# Patient Record
Sex: Female | Born: 1940
Health system: Southern US, Community
[De-identification: ages and names within clinical notes are randomized; demographics above are authoritative.]

## PROBLEM LIST (undated history)

## (undated) DIAGNOSIS — I10 Essential (primary) hypertension: Secondary | ICD-10-CM

## (undated) DIAGNOSIS — E785 Hyperlipidemia, unspecified: Secondary | ICD-10-CM

## (undated) HISTORY — DX: Hyperlipidemia, unspecified: E78.5

## (undated) HISTORY — DX: Essential (primary) hypertension: I10

## (undated) HISTORY — PX: TUBAL LIGATION: SHX77

## (undated) HISTORY — PX: CATARACT EXTRACTION: SUR2

---

## 1998-09-12 ENCOUNTER — Other Ambulatory Visit: Admission: RE | Admit: 1998-09-12 | Discharge: 1998-09-12 | Payer: Self-pay | Admitting: *Deleted

## 1999-09-04 ENCOUNTER — Other Ambulatory Visit: Admission: RE | Admit: 1999-09-04 | Discharge: 1999-09-04 | Payer: Self-pay | Admitting: Obstetrics and Gynecology

## 1999-12-21 ENCOUNTER — Emergency Department (HOSPITAL_COMMUNITY): Admission: EM | Admit: 1999-12-21 | Discharge: 1999-12-21 | Payer: Self-pay | Admitting: Emergency Medicine

## 2001-01-27 ENCOUNTER — Other Ambulatory Visit: Admission: RE | Admit: 2001-01-27 | Discharge: 2001-01-27 | Payer: Self-pay | Admitting: Obstetrics and Gynecology

## 2003-08-15 ENCOUNTER — Emergency Department (HOSPITAL_COMMUNITY): Admission: EM | Admit: 2003-08-15 | Discharge: 2003-08-15 | Payer: Self-pay | Admitting: Emergency Medicine

## 2013-06-10 LAB — IFOBT (OCCULT BLOOD): IFOBT: NEGATIVE

## 2013-10-14 ENCOUNTER — Encounter: Payer: Self-pay | Admitting: Gastroenterology

## 2013-10-16 ENCOUNTER — Encounter: Payer: Self-pay | Admitting: Gastroenterology

## 2013-10-16 ENCOUNTER — Ambulatory Visit (INDEPENDENT_AMBULATORY_CARE_PROVIDER_SITE_OTHER): Payer: Commercial Managed Care - HMO | Admitting: Gastroenterology

## 2013-10-16 VITALS — BP 132/76 | HR 88 | Ht 59.0 in | Wt 113.0 lb

## 2013-10-16 DIAGNOSIS — Z1211 Encounter for screening for malignant neoplasm of colon: Secondary | ICD-10-CM

## 2013-10-16 DIAGNOSIS — R197 Diarrhea, unspecified: Secondary | ICD-10-CM

## 2013-10-16 NOTE — Patient Instructions (Signed)
It has been recommended to you by your physician that you have a(n)Colonoscopy  completed. Per your request, we did not schedule the procedure(s) today. Please contact our office at [phone] should you decide to have the procedure completed. 

## 2013-10-16 NOTE — Progress Notes (Signed)
    _                                                                                                                History of Present Illness: Chelsea Walton 73 year old white female referred for evaluation of diarrhea.  She developed dysgeusia and diarrhea after starting a couple of different  antihypertensives.  Since stopping these medicines bowels have returned to normal.  She had she has no current GI complaints including abdominal pain, pyrosis, diarrhea or bleeding.  She has never had a colonoscopy.    Past Medical History  Diagnosis Date  . Hypertension   . Hyperlipemia    Past Surgical History  Procedure Laterality Date  . Tubal ligation     family history includes Diabetes in her maternal grandfather and mother; Heart disease in her brother, father, and paternal aunt. There is no history of Colon cancer. Current Outpatient Prescriptions  Medication Sig Dispense Refill  . pravastatin (PRAVACHOL) 20 MG tablet Uses as directed       No current facility-administered medications for this visit.   Allergies as of 10/16/2013 - Review Complete 10/16/2013  Allergen Reaction Noted  . Metoprolol Nausea And Vomiting 10/16/2013  . Tribenzor [olmesartan-amlodipine-hctz]  10/16/2013    reports that she has never smoked. She has never used smokeless tobacco. She reports that she does not drink alcohol or use illicit drugs.     Review of Systems: Pertinent positive and negative review of systems were noted in the above HPI section. All other review of systems were otherwise negative.  Vital signs were reviewed in today's medical record Physical Exam: General: Well developed , well nourished, no acute distress Skin: anicteric Head: Normocephalic and atraumatic Eyes:  sclerae anicteric, EOMI Ears: Normal auditory acuity Mouth: No deformity or lesions Neck: Supple, no masses or thyromegaly Lungs: Clear throughout to auscultation Heart: Regular rate and rhythm; no  murmurs, rubs or bruits Abdomen: Soft, non tender and non distended. No masses, hepatosplenomegaly or hernias noted. Normal Bowel sounds Rectal:deferred Musculoskeletal: Symmetrical with no gross deformities  Skin: No lesions on visible extremities Pulses:  Normal pulses noted Extremities: No clubbing, cyanosis, edema or deformities noted Neurological: Alert oriented x 4, grossly nonfocal Cervical Nodes:  No significant cervical adenopathy Inguinal Nodes: No significant inguinal adenopathy Psychological:  Alert and cooperative. Normal mood and affect  See Assessment and Plan under Problem List

## 2013-10-16 NOTE — Assessment & Plan Note (Signed)
Plan screening colonoscopy 

## 2013-10-16 NOTE — Assessment & Plan Note (Signed)
Very likely medications-related, now asymptomatic off the medicines.

## 2013-10-28 ENCOUNTER — Encounter: Payer: Self-pay | Admitting: Internal Medicine

## 2013-10-29 ENCOUNTER — Encounter: Payer: Self-pay | Admitting: Internal Medicine

## 2014-09-08 DIAGNOSIS — H2513 Age-related nuclear cataract, bilateral: Secondary | ICD-10-CM | POA: Diagnosis not present

## 2014-09-08 DIAGNOSIS — H25012 Cortical age-related cataract, left eye: Secondary | ICD-10-CM | POA: Diagnosis not present

## 2014-12-22 DIAGNOSIS — Z780 Asymptomatic menopausal state: Secondary | ICD-10-CM | POA: Diagnosis not present

## 2015-03-16 DIAGNOSIS — Z6823 Body mass index (BMI) 23.0-23.9, adult: Secondary | ICD-10-CM | POA: Diagnosis not present

## 2015-03-16 DIAGNOSIS — M810 Age-related osteoporosis without current pathological fracture: Secondary | ICD-10-CM | POA: Diagnosis not present

## 2015-07-13 DIAGNOSIS — M810 Age-related osteoporosis without current pathological fracture: Secondary | ICD-10-CM | POA: Diagnosis not present

## 2015-07-13 DIAGNOSIS — E784 Other hyperlipidemia: Secondary | ICD-10-CM | POA: Diagnosis not present

## 2015-07-13 DIAGNOSIS — I10 Essential (primary) hypertension: Secondary | ICD-10-CM | POA: Diagnosis not present

## 2015-07-27 DIAGNOSIS — E784 Other hyperlipidemia: Secondary | ICD-10-CM | POA: Diagnosis not present

## 2015-07-27 DIAGNOSIS — Z6822 Body mass index (BMI) 22.0-22.9, adult: Secondary | ICD-10-CM | POA: Diagnosis not present

## 2015-07-27 DIAGNOSIS — Z23 Encounter for immunization: Secondary | ICD-10-CM | POA: Diagnosis not present

## 2015-07-27 DIAGNOSIS — M810 Age-related osteoporosis without current pathological fracture: Secondary | ICD-10-CM | POA: Diagnosis not present

## 2015-07-27 DIAGNOSIS — Z1389 Encounter for screening for other disorder: Secondary | ICD-10-CM | POA: Diagnosis not present

## 2015-07-27 DIAGNOSIS — Z0000 Encounter for general adult medical examination without abnormal findings: Secondary | ICD-10-CM | POA: Diagnosis not present

## 2015-07-28 DIAGNOSIS — Z1212 Encounter for screening for malignant neoplasm of rectum: Secondary | ICD-10-CM | POA: Diagnosis not present

## 2016-08-03 DIAGNOSIS — N390 Urinary tract infection, site not specified: Secondary | ICD-10-CM | POA: Diagnosis not present

## 2016-08-03 DIAGNOSIS — M810 Age-related osteoporosis without current pathological fracture: Secondary | ICD-10-CM | POA: Diagnosis not present

## 2016-08-03 DIAGNOSIS — I10 Essential (primary) hypertension: Secondary | ICD-10-CM | POA: Diagnosis not present

## 2016-08-03 DIAGNOSIS — E784 Other hyperlipidemia: Secondary | ICD-10-CM | POA: Diagnosis not present

## 2016-08-03 DIAGNOSIS — R8299 Other abnormal findings in urine: Secondary | ICD-10-CM | POA: Diagnosis not present

## 2016-08-09 DIAGNOSIS — Z6824 Body mass index (BMI) 24.0-24.9, adult: Secondary | ICD-10-CM | POA: Diagnosis not present

## 2016-08-09 DIAGNOSIS — I10 Essential (primary) hypertension: Secondary | ICD-10-CM | POA: Diagnosis not present

## 2016-08-09 DIAGNOSIS — M810 Age-related osteoporosis without current pathological fracture: Secondary | ICD-10-CM | POA: Diagnosis not present

## 2016-08-09 DIAGNOSIS — Z1389 Encounter for screening for other disorder: Secondary | ICD-10-CM | POA: Diagnosis not present

## 2016-08-09 DIAGNOSIS — E784 Other hyperlipidemia: Secondary | ICD-10-CM | POA: Diagnosis not present

## 2016-08-09 DIAGNOSIS — Z0000 Encounter for general adult medical examination without abnormal findings: Secondary | ICD-10-CM | POA: Diagnosis not present

## 2016-08-09 DIAGNOSIS — Z23 Encounter for immunization: Secondary | ICD-10-CM | POA: Diagnosis not present

## 2016-08-16 DIAGNOSIS — Z1212 Encounter for screening for malignant neoplasm of rectum: Secondary | ICD-10-CM | POA: Diagnosis not present

## 2016-10-18 DIAGNOSIS — H25012 Cortical age-related cataract, left eye: Secondary | ICD-10-CM | POA: Diagnosis not present

## 2016-10-18 DIAGNOSIS — H2513 Age-related nuclear cataract, bilateral: Secondary | ICD-10-CM | POA: Diagnosis not present

## 2016-10-18 DIAGNOSIS — H5203 Hypermetropia, bilateral: Secondary | ICD-10-CM | POA: Diagnosis not present

## 2017-03-19 DIAGNOSIS — H25812 Combined forms of age-related cataract, left eye: Secondary | ICD-10-CM | POA: Diagnosis not present

## 2017-03-19 DIAGNOSIS — H25012 Cortical age-related cataract, left eye: Secondary | ICD-10-CM | POA: Diagnosis not present

## 2017-03-19 DIAGNOSIS — H2512 Age-related nuclear cataract, left eye: Secondary | ICD-10-CM | POA: Diagnosis not present

## 2017-04-24 DIAGNOSIS — Z961 Presence of intraocular lens: Secondary | ICD-10-CM | POA: Diagnosis not present

## 2017-08-05 DIAGNOSIS — R82998 Other abnormal findings in urine: Secondary | ICD-10-CM | POA: Diagnosis not present

## 2017-08-05 DIAGNOSIS — E7849 Other hyperlipidemia: Secondary | ICD-10-CM | POA: Diagnosis not present

## 2017-08-05 DIAGNOSIS — I10 Essential (primary) hypertension: Secondary | ICD-10-CM | POA: Diagnosis not present

## 2017-08-05 DIAGNOSIS — M810 Age-related osteoporosis without current pathological fracture: Secondary | ICD-10-CM | POA: Diagnosis not present

## 2017-08-12 DIAGNOSIS — E7849 Other hyperlipidemia: Secondary | ICD-10-CM | POA: Diagnosis not present

## 2017-08-12 DIAGNOSIS — Z0000 Encounter for general adult medical examination without abnormal findings: Secondary | ICD-10-CM | POA: Diagnosis not present

## 2017-08-12 DIAGNOSIS — I10 Essential (primary) hypertension: Secondary | ICD-10-CM | POA: Diagnosis not present

## 2017-08-12 DIAGNOSIS — Z6823 Body mass index (BMI) 23.0-23.9, adult: Secondary | ICD-10-CM | POA: Diagnosis not present

## 2017-08-12 DIAGNOSIS — M810 Age-related osteoporosis without current pathological fracture: Secondary | ICD-10-CM | POA: Diagnosis not present

## 2017-08-12 DIAGNOSIS — Z1389 Encounter for screening for other disorder: Secondary | ICD-10-CM | POA: Diagnosis not present

## 2017-08-19 DIAGNOSIS — Z1212 Encounter for screening for malignant neoplasm of rectum: Secondary | ICD-10-CM | POA: Diagnosis not present

## 2017-08-22 ENCOUNTER — Encounter (HOSPITAL_COMMUNITY): Payer: Self-pay | Admitting: Emergency Medicine

## 2017-08-22 ENCOUNTER — Emergency Department (HOSPITAL_COMMUNITY)
Admission: EM | Admit: 2017-08-22 | Discharge: 2017-08-22 | Disposition: A | Discharge status: Home / Self Care | Payer: Commercial Managed Care - HMO | Attending: Emergency Medicine | Admitting: Emergency Medicine

## 2017-08-22 ENCOUNTER — Other Ambulatory Visit: Payer: Self-pay

## 2017-08-22 ENCOUNTER — Emergency Department (HOSPITAL_COMMUNITY): Payer: Commercial Managed Care - HMO

## 2017-08-22 DIAGNOSIS — Z79899 Other long term (current) drug therapy: Secondary | ICD-10-CM | POA: Diagnosis not present

## 2017-08-22 DIAGNOSIS — M79602 Pain in left arm: Secondary | ICD-10-CM | POA: Diagnosis not present

## 2017-08-22 DIAGNOSIS — Z7902 Long term (current) use of antithrombotics/antiplatelets: Secondary | ICD-10-CM | POA: Insufficient documentation

## 2017-08-22 DIAGNOSIS — I10 Essential (primary) hypertension: Secondary | ICD-10-CM | POA: Diagnosis not present

## 2017-08-22 DIAGNOSIS — R030 Elevated blood-pressure reading, without diagnosis of hypertension: Secondary | ICD-10-CM

## 2017-08-22 DIAGNOSIS — R079 Chest pain, unspecified: Secondary | ICD-10-CM | POA: Diagnosis not present

## 2017-08-22 LAB — I-STAT TROPONIN, ED
TROPONIN I, POC: 0 ng/mL (ref 0.00–0.08)
Troponin i, poc: 0 ng/mL (ref 0.00–0.08)

## 2017-08-22 LAB — CBC
HCT: 44.6 % (ref 36.0–46.0)
Hemoglobin: 15.9 g/dL — ABNORMAL HIGH (ref 12.0–15.0)
MCH: 31.5 pg (ref 26.0–34.0)
MCHC: 35.7 g/dL (ref 30.0–36.0)
MCV: 88.5 fL (ref 78.0–100.0)
PLATELETS: 216 10*3/uL (ref 150–400)
RBC: 5.04 MIL/uL (ref 3.87–5.11)
RDW: 12.2 % (ref 11.5–15.5)
WBC: 7.5 10*3/uL (ref 4.0–10.5)

## 2017-08-22 LAB — BASIC METABOLIC PANEL
Anion gap: 14 (ref 5–15)
BUN: 18 mg/dL (ref 6–20)
CO2: 24 mmol/L (ref 22–32)
CREATININE: 0.8 mg/dL (ref 0.44–1.00)
Calcium: 9.6 mg/dL (ref 8.9–10.3)
Chloride: 101 mmol/L (ref 101–111)
GFR calc Af Amer: >60 mL/min (ref >60)
Glucose, Bld: 94 mg/dL (ref 65–99)
POTASSIUM: 3.4 mmol/L — AB (ref 3.5–5.1)
SODIUM: 139 mmol/L (ref 135–145)

## 2017-08-22 IMAGING — DX DG CHEST 2V
2 series · 2 of 2 positions shown · non-contrast
Comparison: None.

CLINICAL DATA: 76 y/o  F; left arm pain.

EXAM:
CHEST  2 VIEW

[chest pa]
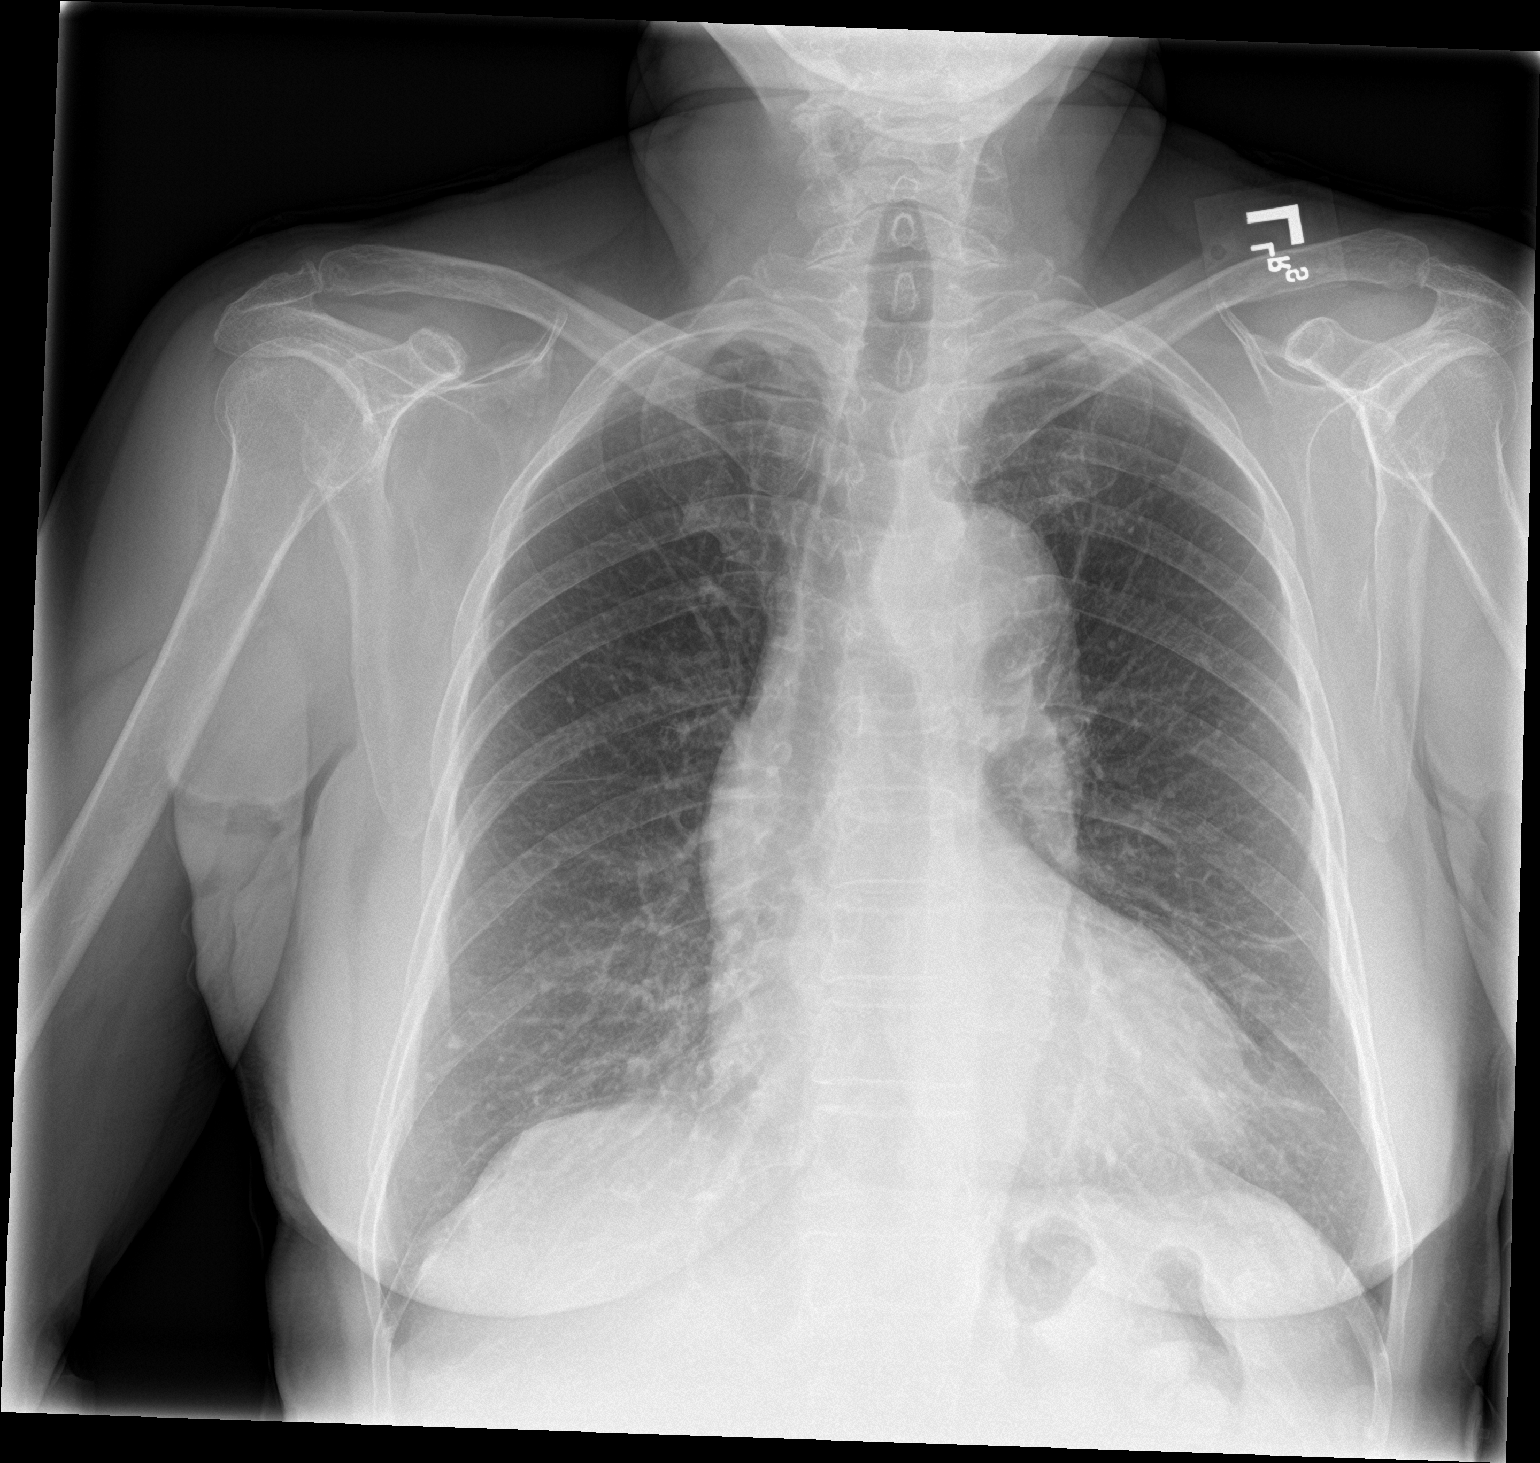

[chest lat]
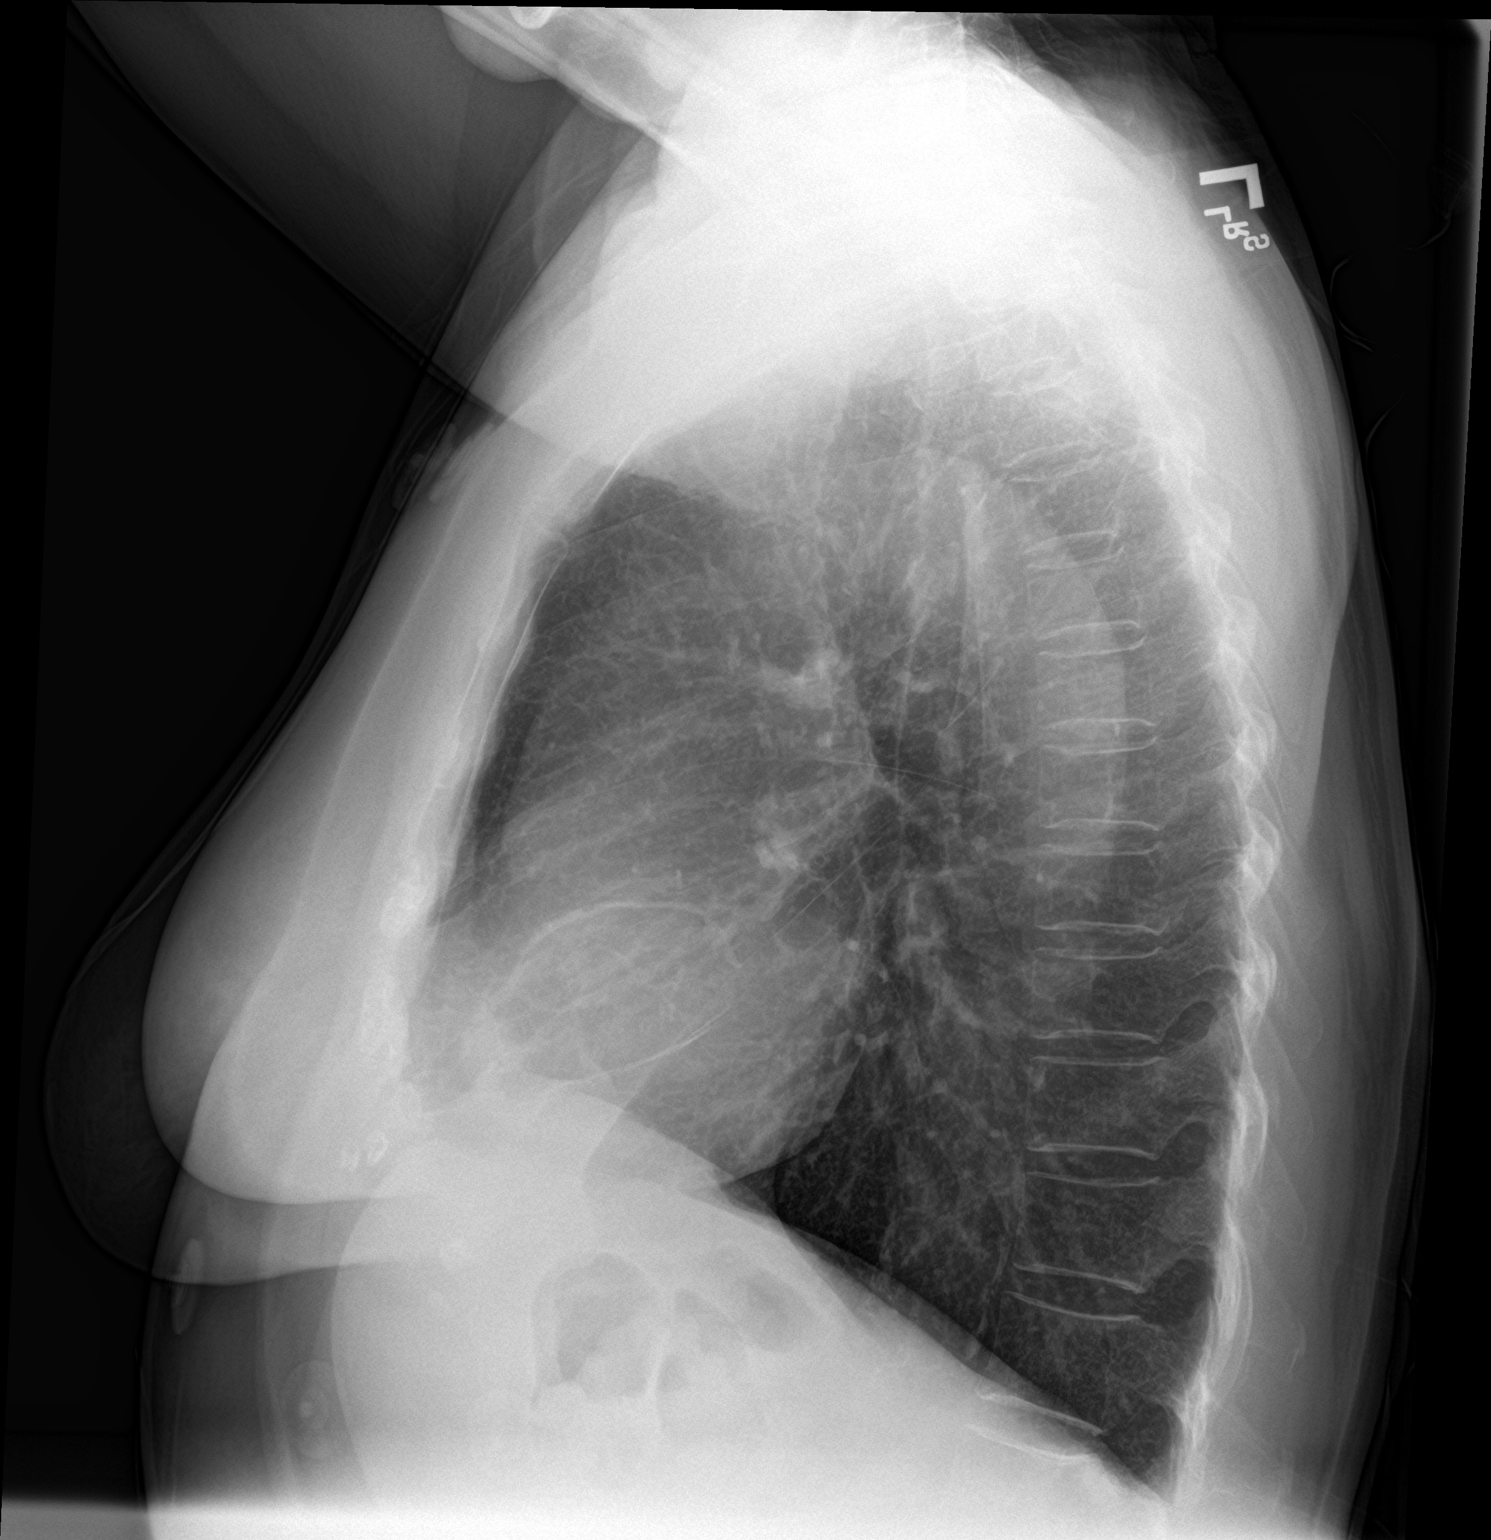

[2 of 2 positions shown; findings below may reference images not displayed]

FINDINGS: Borderline cardiomegaly. Minor atelectasis in left upper lobe
lingula. No consolidation, effusion, or pneumothorax.. The
visualized skeletal structures are unremarkable.
IMPRESSION: No acute pulmonary process identified.

By: MASAAKI M.D.

## 2017-08-22 MED ORDER — CLONIDINE HCL 0.2 MG PO TABS
0.2000 mg | ORAL_TABLET | Freq: Once | ORAL | Status: AC
  Administered 2017-08-22: 0.2 mg via ORAL
  Filled 2017-08-22: qty 1

## 2017-08-22 NOTE — ED Notes (Signed)
Admitting at bedside,  

## 2017-08-22 NOTE — ED Triage Notes (Signed)
Patient with left arm pain for the last 6 hours.  Patient stated that her blood pressure has been high also with the pain.  Patient denies any chest pain at this time.  Patient states that she took her Losartan at midnight and her BP still remains high.  No headache.

## 2017-08-24 NOTE — ED Provider Notes (Signed)
[facility] EMERGENCY DEPARTMENT Provider Note   CSN: [id number] Arrival date & time: 08/22/17  0201     History   Chief Complaint Chief Complaint  Patient presents with  . left arm pain    HPI Chelsea Walton is a 77 y.o. female.  HPI  Patient is a 77 year old female who presents the emergency department with complaints of 6-8 hours of left arm and left upper arm discomfort.  She denies weakness of her arms or legs.  No neck pain.  Denies anterior chest pain.  No associated shortness of breath.  She did check her blood pressure several times at home and reported elevated blood pressures.  She has been compliant with her medications.  She does have a history of hypertension.  No injury or trauma to the left upper extremity.  Denies weakness of the left hand or clumsiness.  Patient is never had symptoms like this before.  No abdominal pain.  Denies nausea vomiting diarrhea.  No fevers or chills or recent upper respiratory symptoms.  Past Medical History:  Diagnosis Date  . Hyperlipemia   . Hypertension     Patient Active Problem List   Diagnosis Date Noted  . Diarrhea 10/16/2013  . Special screening for malignant neoplasms, colon 10/16/2013    Past Surgical History:  Procedure Laterality Date  . TUBAL LIGATION      OB History    No data available       Home Medications    Prior to Admission medications   Medication Sig Start Date End Date Taking? Authorizing Provider  diphenhydramine-acetaminophen (TYLENOL PM) 25-500 MG TABS tablet Take 1 tablet by mouth at bedtime.   Yes [provider]  ibandronate (BONIVA) 150 MG tablet Take 150 mg by mouth every 30 (thirty) days. Take in the morning with a full glass of water, on an empty stomach, and do not take anything else by mouth or lie down for the next 30 min.   Yes [provider]  losartan-hydrochlorothiazide (HYZAAR) 100-12.5 MG tablet Take 1 tablet by mouth daily.   Yes [provider]  pravastatin (PRAVACHOL) 20 MG tablet Take 40 mg by mouth daily. Uses as directed  09/22/13  Yes [provider]    Family History Family History  Problem Relation Age of Onset  . Diabetes Mother   . Diabetes Maternal Grandfather   . Heart disease Father   . Heart disease Brother   . Heart disease Paternal Aunt   . Colon cancer Neg Hx     Social History Social History   Tobacco Use  . Smoking status: Never Smoker  . Smokeless tobacco: Never Used  Substance Use Topics  . Alcohol use: No  . Drug use: No     Allergies   Metoprolol and Tribenzor [olmesartan-amlodipine-hctz]   Review of Systems Review of Systems  All other systems reviewed and are negative.    Physical Exam Updated Vital Signs BP (!) 116/94   Pulse 91   Temp 98.5 F (36.9 C)   Resp 14   SpO2 98%   Physical Exam  Constitutional: She is oriented to person, place, and time. She appears well-developed and well-nourished. No distress.  HENT:  Head: Normocephalic and atraumatic.  Eyes: EOM are normal.  Neck: Normal range of motion.  Cardiovascular: Normal rate, regular rhythm and normal heart sounds.  Pulmonary/Chest: Effort normal and breath sounds normal.  Abdominal: Soft. She exhibits no distension. There is no tenderness.  Musculoskeletal:  Strong radial and PT and DP pulses bilaterally.  No swelling of the left upper extremity as compared to the right.  No significant focal tenderness of the left upper extremity.  Full range of motion of left shoulder, left elbow, left wrist.  Normal grip strength of left hand.  No signs of zoster noted over the distribution of the left arm or left scapular/shoulder region.  Neurological: She is alert and oriented to person, place, and time.  Skin: Skin is warm and dry.  Psychiatric: She has a normal mood and affect. Judgment normal.  Nursing note and vitals reviewed.    ED Treatments / Results  Labs (all labs ordered are listed, but  only abnormal results are displayed) Labs Reviewed  BASIC METABOLIC PANEL - Abnormal; Notable for the following components:      Result Value   Potassium 3.4 (*)    All other components within normal limits  CBC - Abnormal; Notable for the following components:   Hemoglobin 15.9 (*)    All other components within normal limits  I-STAT TROPONIN, ED  I-STAT TROPONIN, ED    EKG  EKG Interpretation  Date/Time:  Thursday August 22 2017 02:14:51 EST Ventricular Rate:  90 PR Interval:  140 QRS Duration: 78 QT Interval:  372 QTC Calculation: 455 R Axis:   -6 Text Interpretation:  Normal sinus rhythm Low voltage QRS Nonspecific ST abnormality Abnormal ECG No old tracing to compare Confirmed by Jola Schmidt [phone]) on 08/22/2017 7:20:01 AM Also confirmed by Jola Schmidt [phone]), editor Hattie Perch ([id number])  on 08/22/2017 8:28:11 AM       Radiology Dg Chest 2 View  Result Date: 08/22/2017 CLINICAL DATA:  77 y/o  F; left arm pain. EXAM: CHEST  2 VIEW COMPARISON:  None. FINDINGS: Borderline cardiomegaly. Minor atelectasis in left upper lobe lingula. No consolidation, effusion, or pneumothorax. The visualized skeletal structures are unremarkable. IMPRESSION: No acute pulmonary process identified. Electronically Signed   By: Kristine Garbe M.D.   On: 08/22/2017 02:32    Procedures Procedures (including critical care time)  Medications Ordered in ED Medications  cloNIDine (CATAPRES) tablet 0.2 mg (0.2 mg Oral Given 08/22/17 0904)     Initial Impression / Assessment and Plan / ED Course  I have reviewed the triage vital signs and the nursing notes.  Pertinent labs & imaging results that were available during my care of the patient were reviewed by me and considered in my medical decision making (see chart for details).     Nonspecific left arm pain in the setting of elevated blood pressure.  Compliant with her medications.  No signs to suggest dissection at this  time.  Normal strong left radial pulse.  Doubt stroke.  Doubt cervical radiculopathy.  Doubt atypical presentation for ACS.  EKG without ischemic changes.  Troponin negative.  Blood pressure improved with oral clonidine here.  I will not add additional medications to her home regimen.  She will check her blood pressure twice a day and begin a log that she can take to her primary care physician for ambulatory blood pressures in the home setting.  Patient and family agreeable to outpatient plan and close primary care follow-up.  I do not think she needs additional testing at this time.  Troponin negative x2.  Final Clinical Impressions(s) / ED Diagnoses   Final diagnoses:  Elevated blood pressure reading  Left arm pain    ED Discharge Orders    None       [place],  Lennette Bihari, MD 08/24/17 [id number]

## 2017-08-27 DIAGNOSIS — I10 Essential (primary) hypertension: Secondary | ICD-10-CM | POA: Diagnosis not present

## 2017-08-27 DIAGNOSIS — Z8249 Family history of ischemic heart disease and other diseases of the circulatory system: Secondary | ICD-10-CM | POA: Diagnosis not present

## 2017-08-27 DIAGNOSIS — Z6824 Body mass index (BMI) 24.0-24.9, adult: Secondary | ICD-10-CM | POA: Diagnosis not present

## 2017-08-28 ENCOUNTER — Telehealth: Payer: Self-pay

## 2017-08-28 DIAGNOSIS — I10 Essential (primary) hypertension: Secondary | ICD-10-CM | POA: Diagnosis not present

## 2017-08-28 NOTE — Telephone Encounter (Signed)
SENT NOTES TO SCHEDULING 

## 2017-09-03 ENCOUNTER — Encounter: Payer: Self-pay | Admitting: Cardiology

## 2017-09-03 ENCOUNTER — Ambulatory Visit: Payer: Medicare HMO | Admitting: Cardiology

## 2017-09-03 VITALS — BP 134/82 | HR 92 | Ht 60.0 in | Wt 123.0 lb

## 2017-09-03 DIAGNOSIS — M79602 Pain in left arm: Secondary | ICD-10-CM | POA: Diagnosis not present

## 2017-09-03 DIAGNOSIS — I10 Essential (primary) hypertension: Secondary | ICD-10-CM

## 2017-09-03 DIAGNOSIS — E7800 Pure hypercholesterolemia, unspecified: Secondary | ICD-10-CM | POA: Diagnosis not present

## 2017-09-03 NOTE — Progress Notes (Signed)
Cardiology Office Note   Date:  09/03/2017   ID:  Chelsea Walton, DOB 14-Sep-1940, MRN [id number]  PCP:  Leanna Battles, MD  Cardiologist:   Destine Zirkle [place], MD   Chief Complaint  Patient presents with  . Arm Pain      History of Present Illness: Chelsea Walton is a 77 y.o. female who is seen at the request of Dr. Philip Aspen for evaluation of left arm pain. She has a history of HTN and HLD. Also has a family history of CAD. She was seen in the ED on 08/22/17 with complaints of left arm pain. Ecg showed nonspecific findings. Troponin level negative x 2. Exam benign except she was hypertensive. On follow up with primary care amlodipine was added for BP control.  She reports that her left arm pain was a persistent dull ache that lasted 4-5 hours. She has not had any recurrent pain. No associated chest pain, dyspnea, palpitations, N/V, or diaphoresis. No history of CAD or prior stress testing. She does walk regularly. She does have a strong family history of CAD/CHF/MI.     Past Medical History:  Diagnosis Date  . Hyperlipemia   . Hypertension     Past Surgical History:  Procedure Laterality Date  . CATARACT EXTRACTION    . TUBAL LIGATION       Current Outpatient Medications  Medication Sig Dispense Refill  . amLODipine (NORVASC) 2.5 MG tablet Take 5 mg by mouth.     . diphenhydramine-acetaminophen (TYLENOL PM) 25-500 MG TABS tablet Take 1 tablet by mouth at bedtime.    . ibandronate (BONIVA) 150 MG tablet Take 150 mg by mouth every 30 (thirty) days. Take in the morning with a full glass of water, on an empty stomach, and do not take anything else by mouth or lie down for the next 30 min.    Marland Kitchen losartan-hydrochlorothiazide (HYZAAR) 100-12.5 MG tablet Take 1 tablet by mouth daily.    . pravastatin (PRAVACHOL) 20 MG tablet Take 40 mg by mouth daily. Uses as directed      No current facility-administered medications for this visit.     Allergies:   Metoprolol and Tribenzor  [olmesartan-amlodipine-hctz]    Social History:  The patient  reports that  has never smoked. she has never used smokeless tobacco. She reports that she does not drink alcohol or use drugs.   Family History:  The patient's family history includes Diabetes in her maternal grandfather and mother; Heart disease in her brother, father, mother, and paternal aunt; Heart failure in her father and mother; Peripheral Artery Disease in her brother.    ROS:  Please see the history of present illness.   Otherwise, review of systems are positive for a 20 year history of intermittent numbness on right side. Prior evaluation with CT and carotid dopplers OK.    All other systems are reviewed and negative.    PHYSICAL EXAM: VS:  BP 134/82 (BP Location: Right Arm)   Pulse 92   Ht 5' (1.524 m)   Wt 123 lb (55.8 kg)   BMI 24.02 kg/m  , BMI Body mass index is 24.02 kg/m. GEN: Well nourished, well developed, in no acute distress  HEENT: normal  Neck: no JVD, carotid bruits, or masses Cardiac: RRR; no murmurs, rubs, or gallops,no edema  Respiratory:  clear to auscultation bilaterally, normal work of breathing GI: soft, nontender, nondistended, + BS MS: no deformity or atrophy  Skin: warm and dry, no rash Neuro:  Strength and sensation are intact Psych: euthymic mood, full affect   EKG:  EKG is not ordered today. The ekg ordered from 08/22/17 demonstrates NSR with mild nonspecific ST abnormality. I have personally reviewed and interpreted this study.    Recent Labs: 08/22/2017: BUN 18; Creatinine, Ser 0.80; Hemoglobin 15.9; Platelets 216; Potassium 3.4; Sodium 139    Lipid Panel No results found for: CHOL, TRIG, HDL, CHOLHDL, VLDL, LDLCALC, LDLDIRECT    Wt Readings from Last 3 Encounters:  09/03/17 123 lb (55.8 kg)  10/16/13 113 lb (51.3 kg)      Other studies Reviewed: Additional studies/ records that were reviewed today include: none   ASSESSMENT AND PLAN:  1.  Acute left arm pain.  Unclear etiology. Concerned this could be an anginal equivalent symptom in patient with risk factors of HTN, HLD, and family history of CAD. Recommend further ischemic work up with a stress Myoview. If normal would focus on risk factor modification.  2. HTN well controlled today. Tolerating medication 3. Hypercholesterolemia. On statin.   Current medicines are reviewed at length with the patient today.  The patient does not have concerns regarding medicines.  The following changes have been made:  no change  Labs/ tests ordered today include:   Orders Placed This Encounter  Procedures  . Myocardial Perfusion Imaging     Signed, Mailey Landstrom [place], MD  09/03/2017 2:06 PM    [facility] [address] [place], [place], [address] Phone [phone], Fax [phone]

## 2017-09-03 NOTE — Patient Instructions (Signed)
Schedule stress myoview

## 2017-09-17 ENCOUNTER — Telehealth (HOSPITAL_COMMUNITY): Payer: Self-pay

## 2017-09-17 NOTE — Telephone Encounter (Signed)
Encounter complete. 

## 2017-09-18 ENCOUNTER — Telehealth (HOSPITAL_COMMUNITY): Payer: Self-pay

## 2017-09-18 NOTE — Telephone Encounter (Signed)
Encounter complete. 

## 2017-09-19 ENCOUNTER — Ambulatory Visit (HOSPITAL_COMMUNITY)
Admission: RE | Admit: 2017-09-19 | Discharge: 2017-09-19 | Disposition: A | Discharge status: Home / Self Care | Payer: Medicare HMO | Source: Ambulatory Visit | Attending: Cardiology | Admitting: Cardiology

## 2017-09-19 DIAGNOSIS — I10 Essential (primary) hypertension: Secondary | ICD-10-CM

## 2017-09-19 DIAGNOSIS — Z8249 Family history of ischemic heart disease and other diseases of the circulatory system: Secondary | ICD-10-CM | POA: Insufficient documentation

## 2017-09-19 DIAGNOSIS — E7800 Pure hypercholesterolemia, unspecified: Secondary | ICD-10-CM | POA: Diagnosis not present

## 2017-09-19 DIAGNOSIS — M79602 Pain in left arm: Secondary | ICD-10-CM

## 2017-09-19 MED ORDER — TECHNETIUM TC 99M TETROFOSMIN IV KIT
31.7000 | PACK | Freq: Once | INTRAVENOUS | Status: AC | PRN
  Administered 2017-09-19: 31.7 via INTRAVENOUS
  Filled 2017-09-19: qty 32

## 2017-09-19 MED ORDER — TECHNETIUM TC 99M TETROFOSMIN IV KIT
10.1000 | PACK | Freq: Once | INTRAVENOUS | Status: AC | PRN
  Administered 2017-09-19: 10.1 via INTRAVENOUS
  Filled 2017-09-19: qty 11

## 2017-09-20 LAB — MYOCARDIAL PERFUSION IMAGING
CHL CUP NUCLEAR SDS: 0
CHL CUP NUCLEAR SSS: 0
CHL RATE OF PERCEIVED EXERTION: 18
CSEPEW: 8.5 METS
Exercise duration (min): 7 min
Exercise duration (sec): 0 s
LV dias vol: 59 mL (ref 46–106)
LVSYSVOL: 24 mL
MPHR: 145 {beats}/min
NUC STRESS TID: 1.03
Peak HR: 142 {beats}/min
Percent HR: 97 %
Rest HR: 78 {beats}/min
SRS: 0

## 2017-09-26 DIAGNOSIS — I10 Essential (primary) hypertension: Secondary | ICD-10-CM | POA: Diagnosis not present

## 2017-09-26 DIAGNOSIS — E7849 Other hyperlipidemia: Secondary | ICD-10-CM | POA: Diagnosis not present

## 2017-09-26 DIAGNOSIS — Z6824 Body mass index (BMI) 24.0-24.9, adult: Secondary | ICD-10-CM | POA: Diagnosis not present

## 2017-09-30 ENCOUNTER — Telehealth: Payer: Self-pay | Admitting: Cardiology

## 2017-09-30 NOTE — Telephone Encounter (Signed)
I don't recommend she take ASA. Her last LDL was 128 on 40 mg pravastatin. Goal would be LDL < 100. I think it is reasonable to increase pravastatin to 80 mg daily  Hayden Kihara Martinique MD, [facility]

## 2017-09-30 NOTE — Telephone Encounter (Signed)
Patient made aware of results and verbalized her understanding.   Notes recorded by Martinique, Peter M, MD on 09/20/2017 at 12:39 PM EST Stress Myoview is normal.  She would like to know: -if she should be taking a 81 mg Aspirin - if her Pravastatin should be increased to 80 mg daily. According to her, her PCP was wondering if it needs to be increased. She did not know why this was mentioned and nothing is in Epic.

## 2017-09-30 NOTE — Telephone Encounter (Signed)
Received call from patient.Dr.Jordan's recommendations given.

## 2017-09-30 NOTE — Telephone Encounter (Signed)
Returned call to patient no answer.LMTC. 

## 2017-09-30 NOTE — Telephone Encounter (Signed)
Patient returning call.

## 2018-04-24 DIAGNOSIS — H2511 Age-related nuclear cataract, right eye: Secondary | ICD-10-CM | POA: Diagnosis not present

## 2018-04-24 DIAGNOSIS — H524 Presbyopia: Secondary | ICD-10-CM | POA: Diagnosis not present

## 2018-04-24 DIAGNOSIS — H5203 Hypermetropia, bilateral: Secondary | ICD-10-CM | POA: Diagnosis not present

## 2018-06-12 DIAGNOSIS — Z23 Encounter for immunization: Secondary | ICD-10-CM | POA: Diagnosis not present

## 2018-08-08 DIAGNOSIS — R82998 Other abnormal findings in urine: Secondary | ICD-10-CM | POA: Diagnosis not present

## 2018-08-08 DIAGNOSIS — I10 Essential (primary) hypertension: Secondary | ICD-10-CM | POA: Diagnosis not present

## 2018-08-08 DIAGNOSIS — M810 Age-related osteoporosis without current pathological fracture: Secondary | ICD-10-CM | POA: Diagnosis not present

## 2018-08-08 DIAGNOSIS — E7849 Other hyperlipidemia: Secondary | ICD-10-CM | POA: Diagnosis not present

## 2018-08-15 DIAGNOSIS — Z6824 Body mass index (BMI) 24.0-24.9, adult: Secondary | ICD-10-CM | POA: Diagnosis not present

## 2018-08-15 DIAGNOSIS — Z1389 Encounter for screening for other disorder: Secondary | ICD-10-CM | POA: Diagnosis not present

## 2018-08-15 DIAGNOSIS — R0789 Other chest pain: Secondary | ICD-10-CM | POA: Diagnosis not present

## 2018-08-15 DIAGNOSIS — Z0000 Encounter for general adult medical examination without abnormal findings: Secondary | ICD-10-CM | POA: Diagnosis not present

## 2018-08-15 DIAGNOSIS — M810 Age-related osteoporosis without current pathological fracture: Secondary | ICD-10-CM | POA: Diagnosis not present

## 2018-08-15 DIAGNOSIS — I10 Essential (primary) hypertension: Secondary | ICD-10-CM | POA: Diagnosis not present

## 2018-08-15 DIAGNOSIS — E7849 Other hyperlipidemia: Secondary | ICD-10-CM | POA: Diagnosis not present

## 2018-08-19 DIAGNOSIS — Z1212 Encounter for screening for malignant neoplasm of rectum: Secondary | ICD-10-CM | POA: Diagnosis not present

## 2019-04-27 DIAGNOSIS — H2511 Age-related nuclear cataract, right eye: Secondary | ICD-10-CM | POA: Diagnosis not present

## 2019-04-27 DIAGNOSIS — Z961 Presence of intraocular lens: Secondary | ICD-10-CM | POA: Diagnosis not present

## 2019-04-27 DIAGNOSIS — H26492 Other secondary cataract, left eye: Secondary | ICD-10-CM | POA: Diagnosis not present

## 2019-04-27 DIAGNOSIS — H5203 Hypermetropia, bilateral: Secondary | ICD-10-CM | POA: Diagnosis not present

## 2019-06-12 DIAGNOSIS — Z23 Encounter for immunization: Secondary | ICD-10-CM | POA: Diagnosis not present

## 2019-08-18 DIAGNOSIS — M810 Age-related osteoporosis without current pathological fracture: Secondary | ICD-10-CM | POA: Diagnosis not present

## 2019-08-18 DIAGNOSIS — E7849 Other hyperlipidemia: Secondary | ICD-10-CM | POA: Diagnosis not present

## 2019-08-21 DIAGNOSIS — Z1339 Encounter for screening examination for other mental health and behavioral disorders: Secondary | ICD-10-CM | POA: Diagnosis not present

## 2019-08-21 DIAGNOSIS — I10 Essential (primary) hypertension: Secondary | ICD-10-CM | POA: Diagnosis not present

## 2019-08-21 DIAGNOSIS — E785 Hyperlipidemia, unspecified: Secondary | ICD-10-CM | POA: Diagnosis not present

## 2019-08-21 DIAGNOSIS — M810 Age-related osteoporosis without current pathological fracture: Secondary | ICD-10-CM | POA: Diagnosis not present

## 2019-08-21 DIAGNOSIS — Z0000 Encounter for general adult medical examination without abnormal findings: Secondary | ICD-10-CM | POA: Diagnosis not present

## 2019-08-21 DIAGNOSIS — Z1331 Encounter for screening for depression: Secondary | ICD-10-CM | POA: Diagnosis not present

## 2019-11-05 DIAGNOSIS — J329 Chronic sinusitis, unspecified: Secondary | ICD-10-CM | POA: Diagnosis not present

## 2019-11-05 DIAGNOSIS — J029 Acute pharyngitis, unspecified: Secondary | ICD-10-CM | POA: Diagnosis not present

## 2020-01-11 DIAGNOSIS — M25551 Pain in right hip: Secondary | ICD-10-CM | POA: Diagnosis not present

## 2020-01-11 DIAGNOSIS — M25559 Pain in unspecified hip: Secondary | ICD-10-CM | POA: Diagnosis not present

## 2020-01-11 DIAGNOSIS — M810 Age-related osteoporosis without current pathological fracture: Secondary | ICD-10-CM | POA: Diagnosis not present

## 2020-01-11 DIAGNOSIS — M1612 Unilateral primary osteoarthritis, left hip: Secondary | ICD-10-CM | POA: Diagnosis not present

## 2020-01-11 DIAGNOSIS — M25552 Pain in left hip: Secondary | ICD-10-CM | POA: Diagnosis not present

## 2020-01-11 DIAGNOSIS — M1611 Unilateral primary osteoarthritis, right hip: Secondary | ICD-10-CM | POA: Diagnosis not present

## 2020-01-30 ENCOUNTER — Inpatient Hospital Stay (HOSPITAL_COMMUNITY)
Admission: EM | Admit: 2020-01-30 | Discharge: 2020-02-02 | DRG: 378 | Disposition: A | Discharge status: Home / Self Care | Payer: Medicare HMO | Attending: Internal Medicine | Admitting: Internal Medicine

## 2020-01-30 ENCOUNTER — Encounter (HOSPITAL_COMMUNITY): Payer: Self-pay

## 2020-01-30 ENCOUNTER — Other Ambulatory Visit: Payer: Self-pay

## 2020-01-30 DIAGNOSIS — D62 Acute posthemorrhagic anemia: Secondary | ICD-10-CM | POA: Diagnosis not present

## 2020-01-30 DIAGNOSIS — D122 Benign neoplasm of ascending colon: Secondary | ICD-10-CM | POA: Diagnosis present

## 2020-01-30 DIAGNOSIS — Z79899 Other long term (current) drug therapy: Secondary | ICD-10-CM | POA: Diagnosis not present

## 2020-01-30 DIAGNOSIS — D649 Anemia, unspecified: Secondary | ICD-10-CM | POA: Diagnosis not present

## 2020-01-30 DIAGNOSIS — R944 Abnormal results of kidney function studies: Secondary | ICD-10-CM | POA: Diagnosis present

## 2020-01-30 DIAGNOSIS — E785 Hyperlipidemia, unspecified: Secondary | ICD-10-CM | POA: Diagnosis present

## 2020-01-30 DIAGNOSIS — D123 Benign neoplasm of transverse colon: Secondary | ICD-10-CM | POA: Diagnosis present

## 2020-01-30 DIAGNOSIS — K921 Melena: Secondary | ICD-10-CM | POA: Diagnosis not present

## 2020-01-30 DIAGNOSIS — K922 Gastrointestinal hemorrhage, unspecified: Secondary | ICD-10-CM | POA: Diagnosis not present

## 2020-01-30 DIAGNOSIS — K6289 Other specified diseases of anus and rectum: Secondary | ICD-10-CM | POA: Diagnosis not present

## 2020-01-30 DIAGNOSIS — Z8249 Family history of ischemic heart disease and other diseases of the circulatory system: Secondary | ICD-10-CM

## 2020-01-30 DIAGNOSIS — R58 Hemorrhage, not elsewhere classified: Secondary | ICD-10-CM | POA: Diagnosis not present

## 2020-01-30 DIAGNOSIS — R55 Syncope and collapse: Secondary | ICD-10-CM | POA: Diagnosis not present

## 2020-01-30 DIAGNOSIS — R52 Pain, unspecified: Secondary | ICD-10-CM | POA: Diagnosis not present

## 2020-01-30 DIAGNOSIS — K3189 Other diseases of stomach and duodenum: Secondary | ICD-10-CM | POA: Diagnosis present

## 2020-01-30 DIAGNOSIS — Z888 Allergy status to other drugs, medicaments and biological substances status: Secondary | ICD-10-CM

## 2020-01-30 DIAGNOSIS — D509 Iron deficiency anemia, unspecified: Secondary | ICD-10-CM | POA: Diagnosis not present

## 2020-01-30 DIAGNOSIS — K222 Esophageal obstruction: Secondary | ICD-10-CM | POA: Diagnosis not present

## 2020-01-30 DIAGNOSIS — K31819 Angiodysplasia of stomach and duodenum without bleeding: Secondary | ICD-10-CM | POA: Diagnosis not present

## 2020-01-30 DIAGNOSIS — K5730 Diverticulosis of large intestine without perforation or abscess without bleeding: Secondary | ICD-10-CM | POA: Diagnosis not present

## 2020-01-30 DIAGNOSIS — R000 Tachycardia, unspecified: Secondary | ICD-10-CM | POA: Diagnosis not present

## 2020-01-30 DIAGNOSIS — K449 Diaphragmatic hernia without obstruction or gangrene: Secondary | ICD-10-CM | POA: Diagnosis not present

## 2020-01-30 DIAGNOSIS — I959 Hypotension, unspecified: Secondary | ICD-10-CM | POA: Diagnosis not present

## 2020-01-30 DIAGNOSIS — K5520 Angiodysplasia of colon without hemorrhage: Secondary | ICD-10-CM | POA: Diagnosis present

## 2020-01-30 DIAGNOSIS — I10 Essential (primary) hypertension: Secondary | ICD-10-CM | POA: Diagnosis present

## 2020-01-30 DIAGNOSIS — Z833 Family history of diabetes mellitus: Secondary | ICD-10-CM

## 2020-01-30 DIAGNOSIS — Z886 Allergy status to analgesic agent status: Secondary | ICD-10-CM | POA: Diagnosis not present

## 2020-01-30 DIAGNOSIS — R0902 Hypoxemia: Secondary | ICD-10-CM | POA: Diagnosis not present

## 2020-01-30 DIAGNOSIS — Z20822 Contact with and (suspected) exposure to covid-19: Secondary | ICD-10-CM | POA: Diagnosis present

## 2020-01-30 LAB — CBC WITH DIFFERENTIAL/PLATELET
Abs Immature Granulocytes: 0.21 10*3/uL — ABNORMAL HIGH (ref 0.00–0.07)
Basophils Absolute: 0 10*3/uL (ref 0.0–0.1)
Basophils Relative: 0 %
Eosinophils Absolute: 0.2 10*3/uL (ref 0.0–0.5)
Eosinophils Relative: 1 %
HCT: 24.6 % — ABNORMAL LOW (ref 36.0–46.0)
Hemoglobin: 7.6 g/dL — ABNORMAL LOW (ref 12.0–15.0)
Immature Granulocytes: 2 %
Lymphocytes Relative: 16 %
Lymphs Abs: 2 10*3/uL (ref 0.7–4.0)
MCH: 28 pg (ref 26.0–34.0)
MCHC: 30.9 g/dL (ref 30.0–36.0)
MCV: 90.8 fL (ref 80.0–100.0)
Monocytes Absolute: 0.8 10*3/uL (ref 0.1–1.0)
Monocytes Relative: 7 %
Neutro Abs: 9.4 10*3/uL — ABNORMAL HIGH (ref 1.7–7.7)
Neutrophils Relative %: 74 %
Platelets: 295 10*3/uL (ref 150–400)
RBC: 2.71 MIL/uL — ABNORMAL LOW (ref 3.87–5.11)
RDW: 13.2 % (ref 11.5–15.5)
WBC: 12.6 10*3/uL — ABNORMAL HIGH (ref 4.0–10.5)
nRBC: 0 % (ref 0.0–0.2)

## 2020-01-30 LAB — POC OCCULT BLOOD, ED: Fecal Occult Bld: POSITIVE — AB

## 2020-01-30 LAB — SARS CORONAVIRUS 2 BY RT PCR (HOSPITAL ORDER, PERFORMED IN ~~LOC~~ HOSPITAL LAB): SARS Coronavirus 2: NEGATIVE

## 2020-01-30 LAB — COMPREHENSIVE METABOLIC PANEL
ALT: 22 U/L (ref 0–44)
AST: 16 U/L (ref 15–41)
Albumin: 3.1 g/dL — ABNORMAL LOW (ref 3.5–5.0)
Alkaline Phosphatase: 92 U/L (ref 38–126)
Anion gap: 11 (ref 5–15)
BUN: 30 mg/dL — ABNORMAL HIGH (ref 8–23)
CO2: 24 mmol/L (ref 22–32)
Calcium: 8.7 mg/dL — ABNORMAL LOW (ref 8.9–10.3)
Chloride: 100 mmol/L (ref 98–111)
Creatinine, Ser: 1.03 mg/dL — ABNORMAL HIGH (ref 0.44–1.00)
GFR calc Af Amer: >60 mL/min (ref >60)
GFR calc non Af Amer: 52 mL/min — ABNORMAL LOW (ref >60)
Glucose, Bld: 134 mg/dL — ABNORMAL HIGH (ref 70–99)
Potassium: 4.2 mmol/L (ref 3.5–5.1)
Sodium: 135 mmol/L (ref 135–145)
Total Bilirubin: 0.5 mg/dL (ref 0.3–1.2)
Total Protein: 6.2 g/dL — ABNORMAL LOW (ref 6.5–8.1)

## 2020-01-30 LAB — ABO/RH: ABO/RH(D): O NEG

## 2020-01-30 LAB — TROPONIN I (HIGH SENSITIVITY): Troponin I (High Sensitivity): <2 ng/L (ref <18)

## 2020-01-30 LAB — HEMOGLOBIN AND HEMATOCRIT, BLOOD
HCT: 24.6 % — ABNORMAL LOW (ref 36.0–46.0)
Hemoglobin: 7.9 g/dL — ABNORMAL LOW (ref 12.0–15.0)

## 2020-01-30 LAB — PREPARE RBC (CROSSMATCH)

## 2020-01-30 MED ORDER — SODIUM CHLORIDE 0.9% IV SOLUTION: Freq: Once | INTRAVENOUS | Status: AC

## 2020-01-30 MED ORDER — ONDANSETRON HCL 4 MG/2ML IJ SOLN: 4.0000 mg | Freq: Four times a day (QID) | INTRAMUSCULAR | Status: DC | PRN

## 2020-01-30 MED ORDER — ACETAMINOPHEN 650 MG RE SUPP: 650.0000 mg | Freq: Four times a day (QID) | RECTAL | Status: DC | PRN

## 2020-01-30 MED ORDER — SODIUM CHLORIDE 0.9 % IV SOLN: INTRAVENOUS | Status: DC

## 2020-01-30 MED ORDER — SODIUM CHLORIDE 0.9 % IV SOLN
80.0000 mg | Freq: Once | INTRAVENOUS | Status: AC
  Administered 2020-01-30: 80 mg via INTRAVENOUS
  Filled 2020-01-30: qty 80

## 2020-01-30 MED ORDER — ONDANSETRON HCL 4 MG/2ML IJ SOLN
4.0000 mg | Freq: Once | INTRAMUSCULAR | Status: AC
  Administered 2020-01-30: 4 mg via INTRAVENOUS
  Filled 2020-01-30: qty 2

## 2020-01-30 MED ORDER — SODIUM CHLORIDE 0.9 % IV SOLN
8.0000 mg/h | INTRAVENOUS | Status: DC
  Administered 2020-01-30 – 2020-01-31 (×2): 8 mg/h via INTRAVENOUS
  Filled 2020-01-30 (×3): qty 80

## 2020-01-30 MED ORDER — SODIUM CHLORIDE 0.9 % IV SOLN: Freq: Once | INTRAVENOUS | Status: DC

## 2020-01-30 MED ORDER — ACETAMINOPHEN 325 MG PO TABS: 650.0000 mg | ORAL_TABLET | Freq: Four times a day (QID) | ORAL | Status: DC | PRN

## 2020-01-30 NOTE — ED Notes (Signed)
Internal medicine at bedside

## 2020-01-30 NOTE — H&P (Addendum)
[facility] History and Physical  Chelsea Walton PYP:[id number] DOB: 07/17/1941 DOA: 01/30/2020  Referring physician: EDP PCP: Leanna Battles, MD   Chief Complaint: black stools  HPI: Chelsea Walton is a 79 y.o. female with past medical history of hypertension, dyslipidemia presented to the ED with dark stools and weakness for the past 2  days.  -She reportedly started using ibuprofen for hip pain 5 weeks ago and then was started on  meloxicam about 2 weeks ago by her PCP for worsening hip pain. -2days ago started noticed black stools and clots, stopped meloxicam and today bleeding got worse so came to the ER. -also noticed weakness, palpitations, lightheadedness. -due to worsening weakness and persistent black stools came to the ED today ED course, patient noted to be mildly tachycardic, blood pressure stable, hemoglobin down to 7.6 from around 15, 2 years ago, BUN elevated at 30  Review of Systems: 14 system review of systems is negative except per HPI  Past Medical History:  Diagnosis Date  . Hyperlipemia   . Hypertension    Past Surgical History:  Procedure Laterality Date  . CATARACT EXTRACTION    . TUBAL LIGATION     Social History:  reports that she has never smoked. She has never used smokeless tobacco. She reports that she does not drink alcohol and does not use drugs.  Allergies  Allergen Reactions  . Metoprolol Nausea And Vomiting    Body aches Chills   . Tribenzor [Olmesartan-Amlodipine-Hctz]     Smell and taste perversion     Family History  Problem Relation Age of Onset  . Diabetes Mother   . Heart failure Mother   . Heart disease Mother   . Diabetes Maternal Grandfather   . Heart disease Father   . Heart failure Father   . Heart disease Brother        CABG and stents  . Heart disease Paternal Aunt   . Peripheral Artery Disease Brother   . Colon cancer Neg Hx     Prior to Admission medications   Medication Sig Start Date End Date  Taking? Authorizing Provider  amLODipine (NORVASC) 2.5 MG tablet Take 5 mg by mouth.  08/27/17   [provider]  diphenhydramine-acetaminophen (TYLENOL PM) 25-500 MG TABS tablet Take 1 tablet by mouth at bedtime.    [provider]  ibandronate (BONIVA) 150 MG tablet Take 150 mg by mouth every 30 (thirty) days. Take in the morning with a full glass of water, on an empty stomach, and do not take anything else by mouth or lie down for the next 30 min.    [provider]  losartan-hydrochlorothiazide (HYZAAR) 100-12.5 MG tablet Take 1 tablet by mouth daily.    [provider]  pravastatin (PRAVACHOL) 80 MG tablet Take 1 tablet (80 mg total) by mouth every evening. 09/30/17   Martinique, Peter M, MD   Physical Exam: Vitals:   01/30/20 1613 01/30/20 1623 01/30/20 1700 01/30/20 1730  BP:   122/87 106/74  Pulse: 99 100 (!) 110 (!) 103  Resp: 12 14 19 11   Temp:    98.5 F (36.9 C)  TempSrc:    Oral  SpO2: 98% 99% 98% 99%  Weight:      Height:        Wt Readings from Last 3 Encounters:  01/30/20 54.4 kg  09/19/17 55.8 kg  09/03/17 55.8 kg   Gen: Awake, Alert, Oriented X 3,  HEENT: PERRLA, Neck supple, no JVD  Lungs: Good air movement bilaterally, CTAB CVS: S1S2/Regular rhythm tachycardia Abd: soft, Non tender, non distended, BS present Extremities: No edema Skin: no new rashes        Labs on Admission:  Basic Metabolic Panel: Recent Labs  Lab 01/30/20 1506  NA 135  K 4.2  CL 100  CO2 24  GLUCOSE 134*  BUN 30*  CREATININE 1.03*  CALCIUM 8.7*   Liver Function Tests: Recent Labs  Lab 01/30/20 1506  AST 16  ALT 22  ALKPHOS 92  BILITOT 0.5  PROT 6.2*  ALBUMIN 3.1*   No results for input(s): LIPASE, AMYLASE in the last 168 hours. No results for input(s): AMMONIA in the last 168 hours. CBC: Recent Labs  Lab 01/30/20 1506  WBC 12.6*  NEUTROABS 9.4*  HGB 7.6*  HCT 24.6*  MCV 90.8  PLT 295   Cardiac Enzymes: No results for  input(s): CKTOTAL, CKMB, CKMBINDEX, TROPONINI in the last 168 hours.  BNP (last 3 results) No results for input(s): BNP in the last 8760 hours.  ProBNP (last 3 results) No results for input(s): PROBNP in the last 8760 hours.  CBG: No results for input(s): GLUCAP in the last 168 hours.  Radiological Exams on Admission: No results found.  EKG: Independently reviewed.  Sinus tachycardia, poor R wave progression  Assessment/Plan  Acute GI blood loss anemia/symptomatic anemia -History of dark stools, NSAID use, elevated BUN concerning for upper GI source, -Start IV PPI -Transfusing 1 unit of PRBC now -Gastroenterology Dr. Benson Norway consulted by EDP, plan for possible endoscopy in a.m., will make n.p.o. after midnight and allow clears for today  Syncope -Likely secondary to blood loss and possibly hypotension -Monitor on telemetry, hold antihypertensives  Essential hypertension -BP stable at this time, hold antihypertensives   dyslipidemia -Continue statin  Code Status: Full code DVT Prophylaxis: SCDs Family Communication:  D/w pt in detail Disposition Plan: home when bleeding has resolved  Inpatient with acute blood loss and symptomatic anemia  Time spent: 25min  Evanell Redlich [facility]

## 2020-01-30 NOTE — ED Provider Notes (Signed)
[facility] EMERGENCY DEPARTMENT Provider Note   CSN: [id number] Arrival date & time: 01/30/20  1448     History Chief Complaint  Patient presents with  . GI Bleeding    Chelsea Walton is a 79 y.o. female.  HPI   79 year old female with a history of hyperlipidemia, hypertension, who presents to the ED for evaluation of bloody stools.  Patient states that she has been on meloxicam for the last 2 weeks for hip pain.  Several days ago she noted that she was having some dark/black stools.  She also had a syncopal episode 2 days ago.  States she does not believe she sustained head trauma as she was laying on a bunch of couch pillows when she woke up from her syncopal episode.  Has had no neuro symptoms since then or any significant pain.  She does report that she has continued to have bloody stools and had about 4 episodes of dark stools with red blood and clots.  She denies any abdominal pain nausea or vomiting.  No hematemesis.  No chest pain.  She has continued to feel fatigued. She also c/o palpitations and feeling lightheaded.  She is not anticoagulated.  She has since stopped taking meloxicam.  She denies any known history of PUD.  Past Medical History:  Diagnosis Date  . Hyperlipemia   . Hypertension     Patient Active Problem List   Diagnosis Date Noted  . Melena 01/30/2020  . Acute blood loss anemia 01/30/2020  . Essential hypertension 01/30/2020  . Dyslipidemia 01/30/2020  . Anemia 01/30/2020  . Diarrhea 10/16/2013  . Special screening for malignant neoplasms, colon 10/16/2013    Past Surgical History:  Procedure Laterality Date  . CATARACT EXTRACTION    . TUBAL LIGATION       OB History   No obstetric history on file.     Family History  Problem Relation Age of Onset  . Diabetes Mother   . Heart failure Mother   . Heart disease Mother   . Diabetes Maternal Grandfather   . Heart disease Father   . Heart failure Father   . Heart disease  Brother        CABG and stents  . Heart disease Paternal Aunt   . Peripheral Artery Disease Brother   . Colon cancer Neg Hx     Social History   Tobacco Use  . Smoking status: Never Smoker  . Smokeless tobacco: Never Used  Substance Use Topics  . Alcohol use: No  . Drug use: No    Home Medications Prior to Admission medications   Medication Sig Start Date End Date Taking? Authorizing Provider  amLODipine (NORVASC) 2.5 MG tablet Take 5 mg by mouth.  08/27/17   [provider]  diphenhydramine-acetaminophen (TYLENOL PM) 25-500 MG TABS tablet Take 1 tablet by mouth at bedtime.    [provider]  ibandronate (BONIVA) 150 MG tablet Take 150 mg by mouth every 30 (thirty) days. Take in the morning with a full glass of water, on an empty stomach, and do not take anything else by mouth or lie down for the next 30 min.    [provider]  losartan-hydrochlorothiazide (HYZAAR) 100-12.5 MG tablet Take 1 tablet by mouth daily.    [provider]  pravastatin (PRAVACHOL) 80 MG tablet Take 1 tablet (80 mg total) by mouth every evening. 09/30/17   Martinique, Peter M, MD    Allergies    Metoprolol and  Tribenzor [olmesartan-amlodipine-hctz]  Review of Systems   Review of Systems  Constitutional: Positive for fatigue. Negative for fever.  HENT: Negative for ear pain and sore throat.   Eyes: Negative for pain and visual disturbance.  Respiratory: Negative for cough and shortness of breath.   Cardiovascular: Positive for palpitations. Negative for chest pain and leg swelling.  Gastrointestinal: Positive for blood in stool. Negative for abdominal pain, constipation, diarrhea, nausea and vomiting.  Genitourinary: Negative for dysuria and hematuria.  Musculoskeletal: Negative for back pain and neck pain.  Skin: Negative for rash.  Neurological: Positive for syncope and light-headedness. Negative for dizziness, seizures, weakness, numbness and headaches.  All other  systems reviewed and are negative.   Physical Exam Updated Vital Signs BP 106/74 (BP Location: Right Arm)   Pulse (!) 103   Temp 98.5 F (36.9 C) (Oral)   Resp 11   Ht 5' (1.524 m)   Wt 54.4 kg   SpO2 99%   BMI 23.44 kg/m   Physical Exam Vitals and nursing note reviewed.  Constitutional:      General: She is not in acute distress.    Appearance: She is well-developed.  HENT:     Head: Normocephalic and atraumatic.  Eyes:     Conjunctiva/sclera: Conjunctivae normal.     Comments: Pale conjunctiva  Cardiovascular:     Rate and Rhythm: Regular rhythm. Tachycardia present.     Heart sounds: Normal heart sounds. No murmur heard.   Pulmonary:     Effort: Pulmonary effort is normal. No respiratory distress.     Breath sounds: Normal breath sounds. No wheezing, rhonchi or rales.  Abdominal:     General: Bowel sounds are normal.     Palpations: Abdomen is soft.     Tenderness: There is no abdominal tenderness. There is no guarding or rebound.  Genitourinary:    Comments: Chaperone present.  DRE performed in stool is melanotic/bloody. Musculoskeletal:     Cervical back: Neck supple.  Skin:    General: Skin is warm and dry.     Coloration: Skin is pale.  Neurological:     Mental Status: She is alert.     ED Results / Procedures / Treatments   Labs (all labs ordered are listed, but only abnormal results are displayed) Labs Reviewed  COMPREHENSIVE METABOLIC PANEL - Abnormal; Notable for the following components:      Result Value   Glucose, Bld 134 (*)    BUN 30 (*)    Creatinine, Ser 1.03 (*)    Calcium 8.7 (*)    Total Protein 6.2 (*)    Albumin 3.1 (*)    GFR calc non Af Amer 52 (*)    All other components within normal limits  CBC WITH DIFFERENTIAL/PLATELET - Abnormal; Notable for the following components:   WBC 12.6 (*)    RBC 2.71 (*)    Hemoglobin 7.6 (*)    HCT 24.6 (*)    Neutro Abs 9.4 (*)    Abs Immature Granulocytes 0.21 (*)    All other components  within normal limits  POC OCCULT BLOOD, ED - Abnormal; Notable for the following components:   Fecal Occult Bld POSITIVE (*)    All other components within normal limits  SARS CORONAVIRUS 2 BY RT PCR ([facility])  TYPE AND SCREEN  PREPARE RBC (CROSSMATCH)  ABO/RH  TROPONIN I (HIGH SENSITIVITY)    EKG EKG Interpretation  Date/Time:  Saturday January 30 2020  16:35:15 EDT Ventricular Rate:  104 PR Interval:    QRS Duration: 91 QT Interval:  337 QTC Calculation: 444 R Axis:   32 Text Interpretation: Sinus tachycardia Low voltage, extremity and precordial leads Abnormal R-wave progression, early transition When compared to prior, faster rate. No STEMI Confirmed by Antony Blackbird [phone]) on 01/30/2020 5:18:25 PM   Radiology No results found.  Procedures Procedures (including critical care time)  CRITICAL CARE Performed by: Rodney Booze   Total critical care time: 33 minutes  Critical care time was exclusive of separately billable procedures and treating other patients.  Critical care was necessary to treat or prevent imminent or life-threatening deterioration.  Critical care was time spent personally by me on the following activities: development of treatment plan with patient and/or surrogate as well as nursing, discussions with consultants, evaluation of patient's response to treatment, examination of patient, obtaining history from patient or surrogate, ordering and performing treatments and interventions, ordering and review of laboratory studies, ordering and review of radiographic studies, pulse oximetry and re-evaluation of patient's condition.   Medications Ordered in ED Medications  pantoprazole (PROTONIX) 80 mg in sodium chloride 0.9 % 100 mL (0.8 mg/mL) infusion (8 mg/hr Intravenous New Bag/Given 01/30/20 1705)  0.9 %  sodium chloride infusion (Manually program via Guardrails IV Fluids) (has no administration in time range)   0.9 %  sodium chloride infusion (has no administration in time range)  ondansetron (ZOFRAN) injection 4 mg (4 mg Intravenous Given 01/30/20 1648)  pantoprazole (PROTONIX) 80 mg in sodium chloride 0.9 % 100 mL IVPB (0 mg Intravenous Stopped 01/30/20 1705)    ED Course  I have reviewed the triage vital signs and the nursing notes.  Pertinent labs & imaging results that were available during my care of the patient were reviewed by me and considered in my medical decision making (see chart for details).    MDM Rules/Calculators/A&P                          79 year old female presenting for evaluation of bloody stools.  Has been on meloxicam for 2 weeks for hip pain and noticed bloody stools several days ago.  Has had increasing frequency of bloody stools and is now passing clots.  Denies abdominal pain  BPs are borderline, she is tachycardic in the room on my evaluation.  Pale appearing.  Abdomen soft and nontender.  CBC with hemoglobin of 7, mild leukocytosis  - 1 unit PRBC given due to ongoing bleeding, episode of syncope at home, tachycardia and borderline BP  CMP with elevated BUN/CR Hemoccult postive Trop neg  EKG Sinus tachycardia Low voltage, extremity and precordial leads Abnormal R-wave progression, early transition When compared to prior, faster rate. No STEMI  5:28 PM CONSULT with Dr. Benson Norway with gastroenterology who will consult on the patient and likely scope her tomorrow.   5:44 PM CONSULT With Dr. Broadus John with [facility] who accepts patient for admission.    Final Clinical Impression(s) / ED Diagnoses Final diagnoses:  Acute GI bleeding  Symptomatic anemia    Rx / DC Orders ED Discharge Orders    None       Bishop Dublin 01/30/20 1749    Tegeler, Gwenyth Allegra, MD 01/31/20 0028

## 2020-01-30 NOTE — ED Triage Notes (Signed)
Pt from home via ems; started noticing dark stool x 2 days; 1 episode of syncope at home yesterday; spoke with PCP; was told to stop taking Meloxicam and to come in Monday, told to call if things got worse; today pt has had 4-5 episodes of bloody stool, each increasing in blood amount; dark and bright red blood, clots; stared to feel light headed; pressure noted to be low on home monitor; no abd pain, no n/v; no hx gi bleed, not on thinners; prior to Meloxicam, pt had been taking ibuprofen for hip pain  118/76 100 HR 94% RA 22 RR

## 2020-01-31 ENCOUNTER — Inpatient Hospital Stay (HOSPITAL_COMMUNITY): Payer: Medicare HMO | Admitting: Anesthesiology

## 2020-01-31 ENCOUNTER — Encounter (HOSPITAL_COMMUNITY)
Admission: EM | Disposition: A | Discharge status: Home / Self Care | Payer: Self-pay | Source: Home / Self Care | Attending: Internal Medicine

## 2020-01-31 HISTORY — PX: HOT HEMOSTASIS: SHX5433

## 2020-01-31 HISTORY — PX: ESOPHAGOGASTRODUODENOSCOPY (EGD) WITH PROPOFOL: SHX5813

## 2020-01-31 LAB — COMPREHENSIVE METABOLIC PANEL
ALT: 19 U/L (ref 0–44)
AST: 14 U/L — ABNORMAL LOW (ref 15–41)
Albumin: 2.6 g/dL — ABNORMAL LOW (ref 3.5–5.0)
Alkaline Phosphatase: 80 U/L (ref 38–126)
Anion gap: 11 (ref 5–15)
BUN: 20 mg/dL (ref 8–23)
CO2: 25 mmol/L (ref 22–32)
Calcium: 8.1 mg/dL — ABNORMAL LOW (ref 8.9–10.3)
Chloride: 103 mmol/L (ref 98–111)
Creatinine, Ser: 0.93 mg/dL (ref 0.44–1.00)
GFR calc Af Amer: >60 mL/min (ref >60)
GFR calc non Af Amer: 59 mL/min — ABNORMAL LOW (ref >60)
Glucose, Bld: 113 mg/dL — ABNORMAL HIGH (ref 70–99)
Potassium: 4.2 mmol/L (ref 3.5–5.1)
Sodium: 139 mmol/L (ref 135–145)
Total Bilirubin: 0.6 mg/dL (ref 0.3–1.2)
Total Protein: 5.3 g/dL — ABNORMAL LOW (ref 6.5–8.1)

## 2020-01-31 LAB — CBC
HCT: 23.2 % — ABNORMAL LOW (ref 36.0–46.0)
HCT: 29.8 % — ABNORMAL LOW (ref 36.0–46.0)
Hemoglobin: 7.6 g/dL — ABNORMAL LOW (ref 12.0–15.0)
Hemoglobin: 9.7 g/dL — ABNORMAL LOW (ref 12.0–15.0)
MCH: 29.2 pg (ref 26.0–34.0)
MCH: 28.8 pg (ref 26.0–34.0)
MCHC: 32.8 g/dL (ref 30.0–36.0)
MCHC: 32.6 g/dL (ref 30.0–36.0)
MCV: 89.2 fL (ref 80.0–100.0)
MCV: 88.4 fL (ref 80.0–100.0)
Platelets: 267 10*3/uL (ref 150–400)
Platelets: 305 10*3/uL (ref 150–400)
RBC: 2.6 MIL/uL — ABNORMAL LOW (ref 3.87–5.11)
RBC: 3.37 MIL/uL — ABNORMAL LOW (ref 3.87–5.11)
RDW: 13.4 % (ref 11.5–15.5)
RDW: 13.3 % (ref 11.5–15.5)
WBC: 10.7 10*3/uL — ABNORMAL HIGH (ref 4.0–10.5)
WBC: 13 10*3/uL — ABNORMAL HIGH (ref 4.0–10.5)
nRBC: 0 % (ref 0.0–0.2)
nRBC: 0.3 % — ABNORMAL HIGH (ref 0.0–0.2)

## 2020-01-31 LAB — HEMOGLOBIN AND HEMATOCRIT, BLOOD
HCT: 28.1 % — ABNORMAL LOW (ref 36.0–46.0)
Hemoglobin: 9.1 g/dL — ABNORMAL LOW (ref 12.0–15.0)

## 2020-01-31 LAB — PROTIME-INR
INR: 1.1 (ref 0.8–1.2)
Prothrombin Time: 13.7 s (ref 11.4–15.2)

## 2020-01-31 LAB — PREPARE RBC (CROSSMATCH)

## 2020-01-31 SURGERY — ESOPHAGOGASTRODUODENOSCOPY (EGD) WITH PROPOFOL
Anesthesia: Monitor Anesthesia Care

## 2020-01-31 MED ORDER — LACTATED RINGERS IV SOLN: INTRAVENOUS | Status: DC | PRN

## 2020-01-31 MED ORDER — PEG 3350-KCL-NA BICARB-NACL 420 G PO SOLR
4000.0000 mL | Freq: Once | ORAL | Status: AC
  Administered 2020-01-31: 4000 mL via ORAL
  Filled 2020-01-31: qty 4000

## 2020-01-31 MED ORDER — PROPOFOL 500 MG/50ML IV EMUL
INTRAVENOUS | Status: DC | PRN
  Administered 2020-01-31: 75 ug/kg/min via INTRAVENOUS

## 2020-01-31 MED ORDER — SODIUM CHLORIDE 0.9 % IV SOLN: INTRAVENOUS | Status: DC

## 2020-01-31 MED ORDER — PANTOPRAZOLE SODIUM 40 MG IV SOLR
40.0000 mg | INTRAVENOUS | Status: DC
  Administered 2020-01-31: 40 mg via INTRAVENOUS
  Filled 2020-01-31: qty 40

## 2020-01-31 MED ORDER — SODIUM CHLORIDE 0.9% IV SOLUTION: Freq: Once | INTRAVENOUS | Status: AC

## 2020-01-31 MED ORDER — ONDANSETRON HCL 4 MG/2ML IJ SOLN
INTRAMUSCULAR | Status: DC | PRN
  Administered 2020-01-31: 4 mg via INTRAVENOUS

## 2020-01-31 SURGICAL SUPPLY — 14 items

## 2020-01-31 NOTE — Op Note (Signed)
[facility] Patient Name: Chelsea Walton Procedure Date : 01/31/2020 MRN: [id number] Attending MD: Carol Ada , MD Date of Birth: 12-Jul-1941 CSN: [id number] Age: 79 Admit Type: Inpatient Procedure:                Upper GI endoscopy Indications:              Acute post hemorrhagic anemia, Heme positive stool,                            Melena Providers:                Carol Ada, MD, Wynonia Sours, RN, Tyrone Apple, Technician, Laverda Sorenson, Technician,                            Eligha Bridegroom CRNA, CRNA Referring MD:              Medicines:                Propofol per Anesthesia Complications:            No immediate complications. Estimated Blood Loss:     Estimated blood loss: none. Procedure:                Pre-Anesthesia Assessment:                           - Prior to the procedure, a History and Physical                            was performed, and patient medications and                            allergies were reviewed. The patient's tolerance of                            previous anesthesia was also reviewed. The risks                            and benefits of the procedure and the sedation                            options and risks were discussed with the patient.                            All questions were answered, and informed consent                            was obtained. Prior Anticoagulants: The patient has                            taken no previous anticoagulant or antiplatelet                            agents. ASA Grade  Assessment: II - A patient with                            mild systemic disease. After reviewing the risks                            and benefits, the patient was deemed in                            satisfactory condition to undergo the procedure.                           - Sedation was administered by an anesthesia                            professional. Deep sedation was attained.                            After obtaining informed consent, the endoscope was                            passed under direct vision. Throughout the                            procedure, the patient's blood pressure, pulse, and                            oxygen saturations were monitored continuously. The                            [id number]-H190 ([id number]) Olympus gastroscope was                            introduced through the mouth, and advanced to the                            second part of duodenum. The upper GI endoscopy was                            accomplished without difficulty. The patient                            tolerated the procedure well. Scope In: Scope Out: Findings:      A mild Schatzki ring was found at the gastroesophageal junction.      A 2 cm hiatal hernia was present.      Localized mildly erythematous mucosa without bleeding was found at the       pylorus.      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 Coagulation for tissue destruction using monopolar probe       was successful. Estimated blood loss: none.      There was no evidence of any clear source for an upper GI bleed. A very       small patch of erythema was  found in the pylorus, but this is not the       source of bleeding. 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 Impression:               - Mild Schatzki ring.                           - 2 cm hiatal hernia.                           - Erythematous mucosa in the pylorus.                           - A single non-bleeding angiodysplastic lesion in                            the duodenum. Treated with a monopolar probe.                           - No specimens collected. Recommendation:           - Return patient to hospital ward for ongoing care.                           - Clear liquid diet.                           - Continue present  medications.                           - Colonoscopy tomorrow. Procedure Code(s):        --- Professional ---                           [phone], Esophagogastroduodenoscopy, flexible,                            transoral; with ablation of tumor(s), polyp(s), or                            other lesion(s) (includes pre- and post-dilation                            and guide wire passage, when performed) Diagnosis Code(s):        --- Professional ---                           K92.1, Melena (includes Hematochezia)                           K22.2, Esophageal obstruction                           K44.9, Diaphragmatic hernia without obstruction or  gangrene                           K31.89, Other diseases of stomach and duodenum                           K31.819, Angiodysplasia of stomach and duodenum                            without bleeding                           D62, Acute posthemorrhagic anemia                           R19.5, Other fecal abnormalities CPT copyright 2019 American Medical Association. All rights reserved. The codes documented in this report are preliminary and upon coder review may  be revised to meet current compliance requirements. Carol Ada, MD Carol Ada, MD 01/31/2020 10:43:19 AM This report has been signed electronically. Number of Addenda: 0

## 2020-01-31 NOTE — H&P (View-Only) (Signed)
Consult Note for [facility]  Reason for Consult: GI bleed Referring Physician: [facility]  Marva Panda HPI: This is a 79 year old female with a PMH of hyperlipidemia and HTN admitted for symptomatic anemia and melena.  Her melena started two days ago and she denies any prior history of GI bleed.  She was taking Meloxicam for the past two weeks for complaints of hip pain.  Her hips started to bother her when she was on two recent trips in May and June.  Prior to this time she was taking ibuprofen and Tylenol for neck and shoulder pain, but she never took the Meloxicam and ibuprofen together.  She reports have an endoscopy 40 years ago for work up of gallbladder issues and she never had a colonoscopy.  The patient is feeling better with some fluid resuscitation, but she finds it hard to rest in the hospital.  Upon presentation her HGB dropped from 15.9 g/dL (08/22/2017) down to 7.6 g/dL.  Past Medical History:  Diagnosis Date  . Hyperlipemia   . Hypertension     Past Surgical History:  Procedure Laterality Date  . CATARACT EXTRACTION    . TUBAL LIGATION      Family History  Problem Relation Age of Onset  . Diabetes Mother   . Heart failure Mother   . Heart disease Mother   . Diabetes Maternal Grandfather   . Heart disease Father   . Heart failure Father   . Heart disease Brother        CABG and stents  . Heart disease Paternal Aunt   . Peripheral Artery Disease Brother   . Colon cancer Neg Hx     Social History:  reports that she has never smoked. She has never used smokeless tobacco. She reports that she does not drink alcohol and does not use drugs.  Allergies:  Allergies  Allergen Reactions  . Meloxicam Other (See Comments)    Intestinal bleeding   . Tribenzor [Olmesartan-Amlodipine-Hctz] Nausea And Vomiting and Other (See Comments)    Smell and taste perversion (could not even tolerate "the smell of water")  . Metoprolol Nausea And Vomiting and Other (See  Comments)    Body aches and chills, also    Medications:  Scheduled: . sodium chloride   Intravenous Once   Continuous: . pantoprozole (PROTONIX) infusion 8 mg/hr (01/31/20 0050)    Results for orders placed or performed during the hospital encounter of 01/30/20 (from the past 24 hour(s))  Comprehensive metabolic panel     Status: Abnormal   Collection Time: 01/30/20  3:06 PM  Result Value Ref Range   Sodium 135 135 - 145 mmol/L   Potassium 4.2 3.5 - 5.1 mmol/L   Chloride 100 98 - 111 mmol/L   CO2 24 22 - 32 mmol/L   Glucose, Bld 134 (H) 70 - 99 mg/dL   BUN 30 (H) 8 - 23 mg/dL   Creatinine, Ser 1.03 (H) 0.44 - 1.00 mg/dL   Calcium 8.7 (L) 8.9 - 10.3 mg/dL   Total Protein 6.2 (L) 6.5 - 8.1 g/dL   Albumin 3.1 (L) 3.5 - 5.0 g/dL   AST 16 15 - 41 U/L   ALT 22 0 - 44 U/L   Alkaline Phosphatase 92 38 - 126 U/L   Total Bilirubin 0.5 0.3 - 1.2 mg/dL   GFR calc non Af Amer 52 (L) >60 mL/min   GFR calc Af Amer >60 >60 mL/min   Anion gap 11 5 -  15  CBC WITH DIFFERENTIAL     Status: Abnormal   Collection Time: 01/30/20  3:06 PM  Result Value Ref Range   WBC 12.6 (H) 4.0 - 10.5 K/uL   RBC 2.71 (L) 3.87 - 5.11 MIL/uL   Hemoglobin 7.6 (L) 12.0 - 15.0 g/dL   HCT 24.6 (L) 36 - 46 %   MCV 90.8 80.0 - 100.0 fL   MCH 28.0 26.0 - 34.0 pg   MCHC 30.9 30.0 - 36.0 g/dL   RDW 13.2 11.5 - 15.5 %   Platelets 295 150 - 400 K/uL   nRBC 0.0 0.0 - 0.2 %   Neutrophils Relative % 74 %   Neutro Abs 9.4 (H) 1.7 - 7.7 K/uL   Lymphocytes Relative 16 %   Lymphs Abs 2.0 0.7 - 4.0 K/uL   Monocytes Relative 7 %   Monocytes Absolute 0.8 0 - 1 K/uL   Eosinophils Relative 1 %   Eosinophils Absolute 0.2 0 - 0 K/uL   Basophils Relative 0 %   Basophils Absolute 0.0 0 - 0 K/uL   Immature Granulocytes 2 %   Abs Immature Granulocytes 0.21 (H) 0.00 - 0.07 K/uL  Troponin I (High Sensitivity)     Status: None   Collection Time: 01/30/20  3:06 PM  Result Value Ref Range   Troponin I (High Sensitivity) <2 <18  ng/L  Type and screen [facility]     Status: None (Preliminary result)   Collection Time: 01/30/20  3:07 PM  Result Value Ref Range   ABO/RH(D) O NEG    Antibody Screen NEG    Sample Expiration 02/02/2020,2359    Unit Number [id number]    Blood Component Type RED CELLS,LR    Unit division 00    Status of Unit ISSUED,FINAL    Transfusion Status OK TO TRANSFUSE    Crossmatch Result Compatible    Unit Number [id number]    Blood Component Type RED CELLS,LR    Unit division 00    Status of Unit ISSUED    Transfusion Status OK TO TRANSFUSE    Crossmatch Result      Compatible Performed at [facility], [address], [place], [place] [address]   ABO/Rh     Status: None   Collection Time: 01/30/20  3:07 PM  Result Value Ref Range   ABO/RH(D)      O NEG Performed at [facility] [address] [place], [place] [address]   POC occult blood, ED     Status: Abnormal   Collection Time: 01/30/20  3:29 PM  Result Value Ref Range   Fecal Occult Bld POSITIVE (A) NEGATIVE  Prepare RBC (crossmatch)     Status: None   Collection Time: 01/30/20  4:01 PM  Result Value Ref Range   Order Confirmation      ORDER PROCESSED BY BLOOD BANK Performed at [facility], [place] [address] [place], [place] [address]   SARS Coronavirus 2 by RT PCR (hospital order, performed in [facility] hospital lab) Nasopharyngeal Nasopharyngeal Swab     Status: None   Collection Time: 01/30/20  4:15 PM   Specimen: Nasopharyngeal Swab  Result Value Ref Range   SARS Coronavirus 2 NEGATIVE NEGATIVE  Hemoglobin and hematocrit, blood     Status: Abnormal   Collection Time: 01/30/20 10:53 PM  Result Value Ref Range   Hemoglobin 7.9 (L) 12.0 - 15.0 g/dL   HCT 24.6 (L) 36 - 46 %  CBC     Status: Abnormal   Collection Time: 01/31/20  4:45 AM  Result Value Ref Range   WBC 10.7 (H) 4.0 - 10.5 K/uL   RBC 2.60 (L) 3.87 - 5.11 MIL/uL   Hemoglobin 7.6 (L) 12.0 - 15.0 g/dL   HCT  23.2 (L) 36 - 46 %   MCV 89.2 80.0 - 100.0 fL   MCH 29.2 26.0 - 34.0 pg   MCHC 32.8 30.0 - 36.0 g/dL   RDW 13.4 11.5 - 15.5 %   Platelets 267 150 - 400 K/uL   nRBC 0.0 0.0 - 0.2 %  Comprehensive metabolic panel     Status: Abnormal   Collection Time: 01/31/20  4:45 AM  Result Value Ref Range   Sodium 139 135 - 145 mmol/L   Potassium 4.2 3.5 - 5.1 mmol/L   Chloride 103 98 - 111 mmol/L   CO2 25 22 - 32 mmol/L   Glucose, Bld 113 (H) 70 - 99 mg/dL   BUN 20 8 - 23 mg/dL   Creatinine, Ser 0.93 0.44 - 1.00 mg/dL   Calcium 8.1 (L) 8.9 - 10.3 mg/dL   Total Protein 5.3 (L) 6.5 - 8.1 g/dL   Albumin 2.6 (L) 3.5 - 5.0 g/dL   AST 14 (L) 15 - 41 U/L   ALT 19 0 - 44 U/L   Alkaline Phosphatase 80 38 - 126 U/L   Total Bilirubin 0.6 0.3 - 1.2 mg/dL   GFR calc non Af Amer 59 (L) >60 mL/min   GFR calc Af Amer >60 >60 mL/min   Anion gap 11 5 - 15  Protime-INR     Status: None   Collection Time: 01/31/20  4:45 AM  Result Value Ref Range   Prothrombin Time 13.7 11.4 - 15.2 seconds   INR 1.1 0.8 - 1.2  Prepare RBC (crossmatch)     Status: None   Collection Time: 01/31/20  7:19 AM  Result Value Ref Range   Order Confirmation      ORDER PROCESSED BY BLOOD BANK Performed at [facility], [address] [place], [place] [address]      No results found.  ROS:  As stated above in the HPI otherwise negative.  Blood pressure 102/80, pulse (!) 106, temperature 98.6 F (37 C), temperature source Oral, resp. rate 18, height 5' (1.524 m), weight 54.4 kg, SpO2 96 %.    PE: Gen: NAD, Alert and Oriented HEENT:  [place]/AT, EOMI Neck: Supple, no LAD Lungs: CTA Bilaterally CV: RRR without M/G/R ABD: Soft, NTND, +BS Ext: No C/C/E  Assessment/Plan: 1) Melena. 2) Anemia. 3) Heme positive stool.   Her clinical presentation and blood work are consistent with PUD from NSAIDs.  An EGD will be performed for further evaluation.  She is hemodynamically stable.  Plan: 1) EGD this AM.  Luisalberto Beegle  D 01/31/2020, 8:05 AM

## 2020-01-31 NOTE — Progress Notes (Signed)
PROGRESS NOTE    Chelsea Walton  JEH:[id number] DOB: 1941/05/27 DOA: 01/30/2020 PCP: Leanna Battles, MD  Brief Narrative:  Chelsea Walton is a 79 y.o. female with past medical history of hypertension, dyslipidemia presented to the ED with dark stools and weakness for the past 2  days, h/o NSAID use for hip pain.,  Passed out 2 days ago. 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, treated with cautery, not felt to be sufficient to explain the degree of blood loss -Plan for colonoscopy tomorrow -Transfusing second unit of PRBC today -Appreciate gastroenterology input, continue PPI IV today  Syncope -Likely secondary to blood loss and possibly hypotension -Monitor on telemetry, hold antihypertensives  Essential hypertension -BP stable at this time, hold antihypertensives   dyslipidemia -Continue statin  Code Status: Full code DVT Prophylaxis: SCDs Family Communication:  spouse at bedside 79 disposition Plan:  Status is: Inpatient  Remains inpatient appropriate because:Inpatient level of care appropriate due to severity of illness   Dispo: The patient is from: Home              Anticipated d/c is to: Home              Anticipated d/c date is: 2 days              Patient currently is not medically stable to d/c.  Consultants:   Dr.Hung  Procedures: EGD:  Impression:                                        - Mild Schatzki ring.                           - 2 cm hiatal hernia.                           - Erythematous mucosa in the pylorus.                           - A single non-bleeding angiodysplastic lesion in                              the duodenum. Treated with a monopolar probe.  Antimicrobials:    Subjective: -Continues to have dark stools with clots  Objective: Vitals:   01/31/20 1045 01/31/20 1057 01/31/20 1100 01/31/20 1202  BP: 112/77 110/71 111/75 112/84  Pulse: 93 97 97 98  Resp: 14 14 11 16   Temp:  97.9 F (36.6 C)  98.2 F (36.8 C)  TempSrc:  Oral  Oral  SpO2: 98% 99% 99% 97%  Weight:      Height:        Intake/Output Summary (Last 24 hours) at 01/31/2020 1339 Last data filed at 01/31/2020 1023 Gross per 24 hour  Intake 1015 ml  Output --  Net 1015 ml   Filed Weights   01/30/20 1454 01/31/20 0915  Weight: 54.4  kg 54.4 kg    Examination:  General exam: AAOx3, no distress Respiratory system: Clear to auscultation Cardiovascular system: S1 & S2 heard, RRR.   Gastrointestinal system: Abdomen is nondistended, soft and nontender.Normal bowel sounds heard. Central nervous system: Alert and oriented. No focal neurological deficits. Extremities: No edema Skin: No rashes on exposed skin Psychiatry: Mood & affect appropriate.     Data Reviewed:   CBC: Recent Labs  Lab 01/30/20 1506 01/30/20 2253 01/31/20 0445  WBC 12.6*  --  10.7*  NEUTROABS 9.4*  --   --   HGB 7.6* 7.9* 7.6*  HCT 24.6* 24.6* 23.2*  MCV 90.8  --  89.2  PLT 295  --  440   Basic Metabolic Panel: Recent Labs  Lab 01/30/20 1506 01/31/20 0445  NA 135 139  K 4.2 4.2  CL 100 103  CO2 24 25  GLUCOSE 134* 113*  BUN 30* 20  CREATININE 1.03* 0.93  CALCIUM 8.7* 8.1*   GFR: Estimated Creatinine Clearance: 36.4 mL/min (by C-G formula based on SCr of 0.93 mg/dL). Liver Function Tests: Recent Labs  Lab 01/30/20 1506 01/31/20 0445  AST 16 14*  ALT 22 19  ALKPHOS 92 80  BILITOT 0.5 0.6  PROT 6.2* 5.3*  ALBUMIN 3.1* 2.6*   No results for input(s): LIPASE, AMYLASE in the last 168 hours. No results for input(s): AMMONIA in the last 168 hours. Coagulation Profile: Recent Labs  Lab 01/31/20 0445  INR 1.1    Cardiac Enzymes: No results for input(s): CKTOTAL, CKMB, CKMBINDEX, TROPONINI in the last 168 hours. BNP (last 3 results) No results for input(s): PROBNP in the last 8760 hours. HbA1C: No results for input(s): HGBA1C in the last 72 hours. CBG: No results for input(s): GLUCAP in the last 168 hours. Lipid Profile: No results for input(s): CHOL, HDL, LDLCALC, TRIG, CHOLHDL, LDLDIRECT in the last 72 hours. Thyroid Function Tests: No results for input(s): TSH, T4TOTAL, FREET4, T3FREE, THYROIDAB in the last 72 hours. Anemia Panel: No results for input(s): VITAMINB12, FOLATE, FERRITIN, TIBC, IRON, RETICCTPCT in the last 72 hours. Urine analysis: No results found for: COLORURINE, APPEARANCEUR, LABSPEC, PHURINE, GLUCOSEU, HGBUR, BILIRUBINUR, KETONESUR, PROTEINUR, UROBILINOGEN, NITRITE, LEUKOCYTESUR Sepsis Labs: @LABRCNTIP (procalcitonin:4,lacticidven:4)  ) Recent Results (from the past 240 hour(s))  SARS Coronavirus 2 by RT PCR (hospital order, performed in [facility] hospital lab) Nasopharyngeal Nasopharyngeal Swab     Status: None   Collection Time: 01/30/20  4:15 PM   Specimen: Nasopharyngeal Swab  Result Value Ref Range Status   SARS Coronavirus 2 NEGATIVE NEGATIVE Final    Comment: (NOTE) SARS-CoV-2 target nucleic acids are NOT DETECTED.  The SARS-CoV-2 RNA is generally detectable in upper and lower respiratory specimens during the acute phase of infection. The lowest concentration of SARS-CoV-2 viral copies this assay can detect is 250 copies / mL. A negative result does not preclude SARS-CoV-2 infection and should not be used as the sole basis for treatment or other patient management decisions.  A negative result may occur with improper specimen collection / handling, submission of specimen other than nasopharyngeal swab, presence of viral mutation(s) within the areas targeted by this assay, and inadequate number of viral copies (<250 copies / mL). A negative result must be  combined with clinical observations, patient history, and epidemiological information.  Fact Sheet for Patients:   StrictlyIdeas.no  Fact Sheet for Healthcare Providers: BankingDealers.co.za  This test is not yet approved or  cleared by the [place] FDA and has been authorized for detection  and/or diagnosis of SARS-CoV-2 by FDA under an Emergency Use Authorization (EUA).  This EUA will remain in effect (meaning this test can be used) for the duration of the COVID-19 declaration under Section 564(b)(1) of the Act, 21 U.S.C. section 360bbb-3(b)(1), unless the authorization is terminated or revoked sooner.  Performed at [facility], [place] [address], [place], [place] [address]          Radiology Studies: No results found.      Scheduled Meds: . polyethylene glycol-electrolytes  4,000 mL Oral Once   Continuous Infusions: . sodium chloride    . pantoprozole (PROTONIX) infusion 8 mg/hr (01/31/20 0050)     LOS: 1 day    Time spent: 19min   Domenic Polite, MD [facility]  01/31/2020, 1:39 PM

## 2020-01-31 NOTE — Anesthesia Preprocedure Evaluation (Signed)
Anesthesia Evaluation  Patient identified by MRN, date of birth, ID band Patient awake    Reviewed: Allergy & Precautions, NPO status , Patient's Chart, lab work & pertinent test results  Airway Mallampati: II  TM Distance: >3 FB Neck ROM: Full    Dental  (+) Dental Advisory Given   Pulmonary neg pulmonary ROS,    breath sounds clear to auscultation       Cardiovascular hypertension, Pt. on medications  Rhythm:Regular Rate:Normal     Neuro/Psych negative neurological ROS     GI/Hepatic negative GI ROS, Neg liver ROS,   Endo/Other  negative endocrine ROS  Renal/GU negative Renal ROS     Musculoskeletal   Abdominal   Peds  Hematology  (+) anemia ,   Anesthesia Other Findings   Reproductive/Obstetrics                             Anesthesia Physical Anesthesia Plan  ASA: III  Anesthesia Plan: MAC   Post-op Pain Management:    Induction:   PONV Risk Score and Plan: 2 and Propofol infusion, Ondansetron and Treatment may vary due to age or medical condition  Airway Management Planned: Simple Face Mask and Nasal Cannula  Additional Equipment:   Intra-op Plan:   Post-operative Plan:   Informed Consent: I have reviewed the patients History and Physical, chart, labs and discussed the procedure including the risks, benefits and alternatives for the proposed anesthesia with the patient or authorized representative who has indicated his/her understanding and acceptance.       Plan Discussed with: CRNA  Anesthesia Plan Comments:         Anesthesia Quick Evaluation

## 2020-01-31 NOTE — Consult Note (Signed)
Consult Note for  GI  Reason for Consult: GI bleed Referring Physician: [facility]  Chelsea Walton HPI: This is a 79 year old female with a PMH of hyperlipidemia and HTN admitted for symptomatic anemia and melena.  Her melena started two days ago and she denies any prior history of GI bleed.  She was taking Meloxicam for the past two weeks for complaints of hip pain.  Her hips started to bother her when she was on two recent trips in May and June.  Prior to this time she was taking ibuprofen and Tylenol for neck and shoulder pain, but she never took the Meloxicam and ibuprofen together.  She reports have an endoscopy 40 years ago for work up of gallbladder issues and she never had a colonoscopy.  The patient is feeling better with some fluid resuscitation, but she finds it hard to rest in the hospital.  Upon presentation her HGB dropped from 15.9 g/dL (08/22/2017) down to 7.6 g/dL.  Past Medical History:  Diagnosis Date  . Hyperlipemia   . Hypertension     Past Surgical History:  Procedure Laterality Date  . CATARACT EXTRACTION    . TUBAL LIGATION      Family History  Problem Relation Age of Onset  . Diabetes Mother   . Heart failure Mother   . Heart disease Mother   . Diabetes Maternal Grandfather   . Heart disease Father   . Heart failure Father   . Heart disease Brother        CABG and stents  . Heart disease Paternal Aunt   . Peripheral Artery Disease Brother   . Colon cancer Neg Hx     Social History:  reports that she has never smoked. She has never used smokeless tobacco. She reports that she does not drink alcohol and does not use drugs.  Allergies:  Allergies  Allergen Reactions  . Meloxicam Other (See Comments)    Intestinal bleeding   . Tribenzor [Olmesartan-Amlodipine-Hctz] Nausea And Vomiting and Other (See Comments)    Smell and taste perversion (could not even tolerate "the smell of water")  . Metoprolol Nausea And Vomiting and Other (See  Comments)    Body aches and chills, also    Medications:  Scheduled: . sodium chloride   Intravenous Once   Continuous: . pantoprozole (PROTONIX) infusion 8 mg/hr (01/31/20 0050)    Results for orders placed or performed during the hospital encounter of 01/30/20 (from the past 24 hour(s))  Comprehensive metabolic panel     Status: Abnormal   Collection Time: 01/30/20  3:06 PM  Result Value Ref Range   Sodium 135 135 - 145 mmol/L   Potassium 4.2 3.5 - 5.1 mmol/L   Chloride 100 98 - 111 mmol/L   CO2 24 22 - 32 mmol/L   Glucose, Bld 134 (H) 70 - 99 mg/dL   BUN 30 (H) 8 - 23 mg/dL   Creatinine, Ser 1.03 (H) 0.44 - 1.00 mg/dL   Calcium 8.7 (L) 8.9 - 10.3 mg/dL   Total Protein 6.2 (L) 6.5 - 8.1 g/dL   Albumin 3.1 (L) 3.5 - 5.0 g/dL   AST 16 15 - 41 U/L   ALT 22 0 - 44 U/L   Alkaline Phosphatase 92 38 - 126 U/L   Total Bilirubin 0.5 0.3 - 1.2 mg/dL   GFR calc non Af Amer 52 (L) >60 mL/min   GFR calc Af Amer >60 >60 mL/min   Anion gap 11 5 -  15  CBC WITH DIFFERENTIAL     Status: Abnormal   Collection Time: 01/30/20  3:06 PM  Result Value Ref Range   WBC 12.6 (H) 4.0 - 10.5 K/uL   RBC 2.71 (L) 3.87 - 5.11 MIL/uL   Hemoglobin 7.6 (L) 12.0 - 15.0 g/dL   HCT 24.6 (L) 36 - 46 %   MCV 90.8 80.0 - 100.0 fL   MCH 28.0 26.0 - 34.0 pg   MCHC 30.9 30.0 - 36.0 g/dL   RDW 13.2 11.5 - 15.5 %   Platelets 295 150 - 400 K/uL   nRBC 0.0 0.0 - 0.2 %   Neutrophils Relative % 74 %   Neutro Abs 9.4 (H) 1.7 - 7.7 K/uL   Lymphocytes Relative 16 %   Lymphs Abs 2.0 0.7 - 4.0 K/uL   Monocytes Relative 7 %   Monocytes Absolute 0.8 0 - 1 K/uL   Eosinophils Relative 1 %   Eosinophils Absolute 0.2 0 - 0 K/uL   Basophils Relative 0 %   Basophils Absolute 0.0 0 - 0 K/uL   Immature Granulocytes 2 %   Abs Immature Granulocytes 0.21 (H) 0.00 - 0.07 K/uL  Troponin I (High Sensitivity)     Status: None   Collection Time: 01/30/20  3:06 PM  Result Value Ref Range   Troponin I (High Sensitivity) <2 <18  ng/L  Type and screen [facility]     Status: None (Preliminary result)   Collection Time: 01/30/20  3:07 PM  Result Value Ref Range   ABO/RH(D) O NEG    Antibody Screen NEG    Sample Expiration 02/02/2020,2359    Unit Number [id number]    Blood Component Type RED CELLS,LR    Unit division 00    Status of Unit ISSUED,FINAL    Transfusion Status OK TO TRANSFUSE    Crossmatch Result Compatible    Unit Number [id number]    Blood Component Type RED CELLS,LR    Unit division 00    Status of Unit ISSUED    Transfusion Status OK TO TRANSFUSE    Crossmatch Result      Compatible Performed at [facility], [address], [place], [place] [address]   ABO/Rh     Status: None   Collection Time: 01/30/20  3:07 PM  Result Value Ref Range   ABO/RH(D)      O NEG Performed at [place] [address], [place], [place] [address]   POC occult blood, ED     Status: Abnormal   Collection Time: 01/30/20  3:29 PM  Result Value Ref Range   Fecal Occult Bld POSITIVE (A) NEGATIVE  Prepare RBC (crossmatch)     Status: None   Collection Time: 01/30/20  4:01 PM  Result Value Ref Range   Order Confirmation      ORDER PROCESSED BY BLOOD BANK Performed at [facility], [place] [address] [place], [place] [address]   SARS Coronavirus 2 by RT PCR (hospital order, performed in [facility] hospital lab) Nasopharyngeal Nasopharyngeal Swab     Status: None   Collection Time: 01/30/20  4:15 PM   Specimen: Nasopharyngeal Swab  Result Value Ref Range   SARS Coronavirus 2 NEGATIVE NEGATIVE  Hemoglobin and hematocrit, blood     Status: Abnormal   Collection Time: 01/30/20 10:53 PM  Result Value Ref Range   Hemoglobin 7.9 (L) 12.0 - 15.0 g/dL   HCT 24.6 (L) 36 - 46 %  CBC     Status: Abnormal   Collection Time: 01/31/20  4:45 AM  Result Value Ref Range   WBC 10.7 (H) 4.0 - 10.5 K/uL   RBC 2.60 (L) 3.87 - 5.11 MIL/uL   Hemoglobin 7.6 (L) 12.0 - 15.0 g/dL   HCT  23.2 (L) 36 - 46 %   MCV 89.2 80.0 - 100.0 fL   MCH 29.2 26.0 - 34.0 pg   MCHC 32.8 30.0 - 36.0 g/dL   RDW 13.4 11.5 - 15.5 %   Platelets 267 150 - 400 K/uL   nRBC 0.0 0.0 - 0.2 %  Comprehensive metabolic panel     Status: Abnormal   Collection Time: 01/31/20  4:45 AM  Result Value Ref Range   Sodium 139 135 - 145 mmol/L   Potassium 4.2 3.5 - 5.1 mmol/L   Chloride 103 98 - 111 mmol/L   CO2 25 22 - 32 mmol/L   Glucose, Bld 113 (H) 70 - 99 mg/dL   BUN 20 8 - 23 mg/dL   Creatinine, Ser 0.93 0.44 - 1.00 mg/dL   Calcium 8.1 (L) 8.9 - 10.3 mg/dL   Total Protein 5.3 (L) 6.5 - 8.1 g/dL   Albumin 2.6 (L) 3.5 - 5.0 g/dL   AST 14 (L) 15 - 41 U/L   ALT 19 0 - 44 U/L   Alkaline Phosphatase 80 38 - 126 U/L   Total Bilirubin 0.6 0.3 - 1.2 mg/dL   GFR calc non Af Amer 59 (L) >60 mL/min   GFR calc Af Amer >60 >60 mL/min   Anion gap 11 5 - 15  Protime-INR     Status: None   Collection Time: 01/31/20  4:45 AM  Result Value Ref Range   Prothrombin Time 13.7 11.4 - 15.2 seconds   INR 1.1 0.8 - 1.2  Prepare RBC (crossmatch)     Status: None   Collection Time: 01/31/20  7:19 AM  Result Value Ref Range   Order Confirmation      ORDER PROCESSED BY BLOOD BANK Performed at [facility], [address] [place], [place] [address]      No results found.  ROS:  As stated above in the HPI otherwise negative.  Blood pressure 102/80, pulse (!) 106, temperature 98.6 F (37 C), temperature source Oral, resp. rate 18, height 5' (1.524 m), weight 54.4 kg, SpO2 96 %.    PE: Gen: NAD, Alert and Oriented HEENT:  [place]/AT, EOMI Neck: Supple, no LAD Lungs: CTA Bilaterally CV: RRR without M/G/R ABD: Soft, NTND, +BS Ext: No C/C/E  Assessment/Plan: 1) Melena. 2) Anemia. 3) Heme positive stool.   Her clinical presentation and blood work are consistent with PUD from NSAIDs.  An EGD will be performed for further evaluation.  She is hemodynamically stable.  Plan: 1) EGD this AM.  Kinley Ferrentino  D 01/31/2020, 8:05 AM

## 2020-01-31 NOTE — Anesthesia Preprocedure Evaluation (Addendum)
Anesthesia Evaluation  Patient identified by MRN, date of birth, ID band Patient awake    Reviewed: Allergy & Precautions, NPO status , Patient's Chart, lab work & pertinent test results  Airway Mallampati: II  TM Distance: >3 FB Neck ROM: Full    Dental no notable dental hx. (+) Teeth Intact, Dental Advisory Given   Pulmonary neg pulmonary ROS,    Pulmonary exam normal breath sounds clear to auscultation       Cardiovascular hypertension, Pt. on medications and Pt. on home beta blockers Normal cardiovascular exam Rhythm:Regular Rate:Normal     Neuro/Psych negative neurological ROS  negative psych ROS   GI/Hepatic negative GI ROS, Neg liver ROS,   Endo/Other  negative endocrine ROS  Renal/GU negative Renal ROS     Musculoskeletal negative musculoskeletal ROS (+)   Abdominal   Peds  Hematology  (+) anemia ,   Anesthesia Other Findings   Reproductive/Obstetrics negative OB ROS                            Anesthesia Physical Anesthesia Plan  ASA: III  Anesthesia Plan: MAC   Post-op Pain Management:    Induction:   PONV Risk Score and Plan: 2 and Treatment may vary due to age or medical condition  Airway Management Planned: Nasal Cannula and Natural Airway  Additional Equipment: None  Intra-op Plan:   Post-operative Plan: Extubation in OR  Informed Consent: I have reviewed the patients History and Physical, chart, labs and discussed the procedure including the risks, benefits and alternatives for the proposed anesthesia with the patient or authorized representative who has indicated his/her understanding and acceptance.     Dental advisory given  Plan Discussed with: CRNA  Anesthesia Plan Comments: (Colonoscopy for melensa and anemia)       Anesthesia Quick Evaluation

## 2020-01-31 NOTE — Transfer of Care (Signed)
Immediate Anesthesia Transfer of Care Note  Patient: MYA SUELL  Procedure(s) Performed: ESOPHAGOGASTRODUODENOSCOPY (EGD) WITH PROPOFOL (N/A ) HOT HEMOSTASIS (ARGON PLASMA COAGULATION/BICAP) (N/A )  Patient Location: PACU  Anesthesia Type:MAC  Level of Consciousness: drowsy  Airway & Oxygen Therapy: Patient Spontanous Breathing  Post-op Assessment: Report given to RN and Post -op Vital signs reviewed and stable  Post vital signs: Reviewed and stable  Last Vitals:  Vitals Value Taken Time  BP 102/73 01/31/20 1030  Temp 36.3 C 01/31/20 1030  Pulse 93 01/31/20 1038  Resp 15 01/31/20 1038  SpO2 97 % 01/31/20 1038  Vitals shown include unvalidated device data.  Last Pain:  Vitals:   01/31/20 1030  TempSrc:   PainSc: 0-No pain         Complications: No complications documented.

## 2020-01-31 NOTE — Anesthesia Postprocedure Evaluation (Signed)
Anesthesia Post Note  Patient: Chelsea Walton  Procedure(s) Performed: ESOPHAGOGASTRODUODENOSCOPY (EGD) WITH PROPOFOL (N/A ) HOT HEMOSTASIS (ARGON PLASMA COAGULATION/BICAP) (N/A )     Patient location during evaluation: PACU Anesthesia Type: MAC Level of consciousness: awake and alert Pain management: pain level controlled Vital Signs Assessment: post-procedure vital signs reviewed and stable Respiratory status: spontaneous breathing, nonlabored ventilation, respiratory function stable and patient connected to nasal cannula oxygen Cardiovascular status: stable and blood pressure returned to baseline Postop Assessment: no apparent nausea or vomiting Anesthetic complications: no   No complications documented.  Last Vitals:  Vitals:   01/31/20 1202 01/31/20 1744  BP: 112/84 (!) 123/92  Pulse: 98 (!) 105  Resp: 16 16  Temp: 36.8 C (!) 36.4 C  SpO2: 97% 100%    Last Pain:  Vitals:   01/31/20 1744  TempSrc: Oral  PainSc:                  Chelsea Walton

## 2020-02-01 ENCOUNTER — Encounter (HOSPITAL_COMMUNITY)
Admission: EM | Disposition: A | Discharge status: Home / Self Care | Payer: Self-pay | Source: Home / Self Care | Attending: Internal Medicine

## 2020-02-01 ENCOUNTER — Inpatient Hospital Stay (HOSPITAL_COMMUNITY): Payer: Medicare HMO | Admitting: Anesthesiology

## 2020-02-01 ENCOUNTER — Encounter (HOSPITAL_COMMUNITY): Payer: Self-pay | Admitting: Internal Medicine

## 2020-02-01 DIAGNOSIS — D123 Benign neoplasm of transverse colon: Secondary | ICD-10-CM

## 2020-02-01 DIAGNOSIS — D122 Benign neoplasm of ascending colon: Secondary | ICD-10-CM

## 2020-02-01 HISTORY — PX: COLONOSCOPY WITH PROPOFOL: SHX5780

## 2020-02-01 HISTORY — PX: GIVENS CAPSULE STUDY: SHX5432

## 2020-02-01 HISTORY — PX: POLYPECTOMY: SHX5525

## 2020-02-01 LAB — CBC
HCT: 24.3 % — ABNORMAL LOW (ref 36.0–46.0)
HCT: 26.3 % — ABNORMAL LOW (ref 36.0–46.0)
Hemoglobin: 7.8 g/dL — ABNORMAL LOW (ref 12.0–15.0)
Hemoglobin: 8.4 g/dL — ABNORMAL LOW (ref 12.0–15.0)
MCH: 28.5 pg (ref 26.0–34.0)
MCH: 28.9 pg (ref 26.0–34.0)
MCHC: 32.1 g/dL (ref 30.0–36.0)
MCHC: 31.9 g/dL (ref 30.0–36.0)
MCV: 88.7 fL (ref 80.0–100.0)
MCV: 90.4 fL (ref 80.0–100.0)
Platelets: 248 10*3/uL (ref 150–400)
Platelets: 265 10*3/uL (ref 150–400)
RBC: 2.74 MIL/uL — ABNORMAL LOW (ref 3.87–5.11)
RBC: 2.91 MIL/uL — ABNORMAL LOW (ref 3.87–5.11)
RDW: 13.4 % (ref 11.5–15.5)
RDW: 13.5 % (ref 11.5–15.5)
WBC: 9.9 10*3/uL (ref 4.0–10.5)
WBC: 10.4 10*3/uL (ref 4.0–10.5)
nRBC: 0.2 % (ref 0.0–0.2)
nRBC: 0.3 % — ABNORMAL HIGH (ref 0.0–0.2)

## 2020-02-01 LAB — BASIC METABOLIC PANEL
Anion gap: 8 (ref 5–15)
BUN: 9 mg/dL (ref 8–23)
CO2: 24 mmol/L (ref 22–32)
Calcium: 8.1 mg/dL — ABNORMAL LOW (ref 8.9–10.3)
Chloride: 110 mmol/L (ref 98–111)
Creatinine, Ser: 0.66 mg/dL (ref 0.44–1.00)
GFR calc Af Amer: >60 mL/min (ref >60)
GFR calc non Af Amer: >60 mL/min (ref >60)
Glucose, Bld: 110 mg/dL — ABNORMAL HIGH (ref 70–99)
Potassium: 3.6 mmol/L (ref 3.5–5.1)
Sodium: 142 mmol/L (ref 135–145)

## 2020-02-01 LAB — TYPE AND SCREEN
ABO/RH(D): O NEG
Antibody Screen: NEGATIVE
Unit division: 0
Unit division: 0

## 2020-02-01 LAB — BPAM RBC
Blood Product Expiration Date: 202107272359
Blood Product Expiration Date: 202107282359
ISSUE DATE / TIME: 202106261714
ISSUE DATE / TIME: 202106270745
Unit Type and Rh: 9500
Unit Type and Rh: 9500

## 2020-02-01 SURGERY — COLONOSCOPY WITH PROPOFOL
Anesthesia: Monitor Anesthesia Care

## 2020-02-01 SURGERY — IMAGING PROCEDURE, GI TRACT, INTRALUMINAL, VIA CAPSULE

## 2020-02-01 MED ORDER — PHENYLEPHRINE HCL (PRESSORS) 10 MG/ML IV SOLN
INTRAVENOUS | Status: DC | PRN
  Administered 2020-02-01: 80 ug via INTRAVENOUS

## 2020-02-01 MED ORDER — SODIUM CHLORIDE 0.9 % IV SOLN: INTRAVENOUS | Status: DC

## 2020-02-01 MED ORDER — PANTOPRAZOLE SODIUM 40 MG PO TBEC
40.0000 mg | DELAYED_RELEASE_TABLET | Freq: Every day | ORAL | Status: DC
  Administered 2020-02-02: 40 mg via ORAL
  Filled 2020-02-01: qty 1

## 2020-02-01 MED ORDER — METOCLOPRAMIDE HCL 5 MG/ML IJ SOLN: 10.0000 mg | Freq: Once | INTRAMUSCULAR | Status: DC

## 2020-02-01 MED ORDER — LACTATED RINGERS IV SOLN
INTRAVENOUS | Status: AC | PRN
  Administered 2020-02-01: 1000 mL via INTRAVENOUS

## 2020-02-01 MED ORDER — PROPOFOL 500 MG/50ML IV EMUL
INTRAVENOUS | Status: DC | PRN
  Administered 2020-02-01: 100 ug/kg/min via INTRAVENOUS

## 2020-02-01 MED ORDER — PROPOFOL 10 MG/ML IV BOLUS
INTRAVENOUS | Status: DC | PRN
  Administered 2020-02-01 (×3): 20 mg via INTRAVENOUS

## 2020-02-01 SURGICAL SUPPLY — 22 items

## 2020-02-01 NOTE — Anesthesia Postprocedure Evaluation (Signed)
Anesthesia Post Note  Patient: Chelsea Walton  Procedure(s) Performed: COLONOSCOPY WITH PROPOFOL (N/A ) POLYPECTOMY     Patient location during evaluation: Endoscopy Anesthesia Type: MAC Level of consciousness: awake and alert Pain management: pain level controlled Vital Signs Assessment: post-procedure vital signs reviewed and stable Respiratory status: spontaneous breathing, nonlabored ventilation, respiratory function stable and patient connected to nasal cannula oxygen Cardiovascular status: blood pressure returned to baseline and stable Postop Assessment: no apparent nausea or vomiting Anesthetic complications: no   No complications documented.  Last Vitals:  Vitals:   02/01/20 0735 02/01/20 0900  BP: (!) 142/88 94/61  Pulse: (!) 105 91  Resp: 15 17  Temp: 36.8 C (!) 36.3 C  SpO2: 99% 99%    Last Pain:  Vitals:   02/01/20 0900  TempSrc: Temporal  PainSc:                  Barnet Glasgow

## 2020-02-01 NOTE — Progress Notes (Signed)
PROGRESS NOTE    Chelsea Walton  XLK:[id number] DOB: [birth year] DOA: 01/30/2020 PCP: Leanna Battles, MD  Brief Narrative:  Chelsea Walton is a 79 y.o. female with past medical history of hypertension, dyslipidemia presented to the ED with dark stools and weakness for the past 2  days, h/o NSAID use for hip pain.,  Passed out 2 days ago. 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 -Continues to have dark stools  Syncope -Likely secondary to blood loss and possibly hypotension -Monitor on telemetry, hold antihypertensives  Essential hypertension -BP stable at this time, hold antihypertensives   dyslipidemia -Continue statin  Code Status: Full code DVT Prophylaxis: SCDs Family Communication:  spouse at bedside 44 disposition Plan:  Status is: Inpatient  Remains inpatient appropriate because:Inpatient level of care appropriate due to severity of illness   Dispo: The patient is from: Home              Anticipated d/c is to: Home              Anticipated d/c date is: 1-2 days              Patient currently is not medically stable to d/c.  Consultants:   Dr.Hung  Procedures: EGD: 6/27 impression:                                        - Mild Schatzki ring.                           - 2 cm hiatal hernia.                           - Erythematous mucosa in the pylorus.                            - A single non-bleeding angiodysplastic lesion in                             the duodenum. Treated with a monopolar probe.  Colonoscopy 6/28 Impression:               - Two 1 to 6 mm polyps in the transverse colon and                            in the ascending colon, removed with a cold snare.                            Resected and retrieved.                           -  Diverticulosis in the sigmoid colon.                           - Anal papilla(e) were hypertrophied.                           - The examination was otherwise normal on direct                            and retroflexion views.  Antimicrobials:    Subjective: -Just back from colonoscopy, 2 polyps removed, continues to have dark stools  Objective: Vitals:   02/01/20 0900 02/01/20 0910 02/01/20 0920 02/01/20 0943  BP: 94/61 (!) 92/58 118/67 131/87  Pulse: 91 97 96 94  Resp: 17 15 (!) 22 18  Temp: (!) 97.4 F (36.3 C)   97.7 F (36.5 C)  TempSrc: Temporal   Tympanic  SpO2: 99%   99%  Weight:      Height:        Intake/Output Summary (Last 24 hours) at 02/01/2020 1019 Last data filed at 02/01/2020 0852 Gross per 24 hour  Intake 659.85 ml  Output 0 ml  Net 659.85 ml   Filed Weights   01/30/20 1454 01/31/20 0915  Weight: 54.4 kg 54.4 kg    Examination:  General exam: AAOx3, no distress  respiratory system: Clear bilaterally Cardiovascular system: S1-S2, regular rate rhythm .   Gastrointestinal system: Soft, nondistended, nontender, bowel sounds present Extremities: No edema Skin: No rashes on exposed skin  psychiatry: Appropriate mood and affect   Data Reviewed:   CBC: Recent Labs  Lab 01/30/20 1506 01/30/20 1506 01/30/20 2253 01/31/20 0445 01/31/20 1500 01/31/20 1827 02/01/20 0439  WBC 12.6*  --   --  10.7*  --  13.0* 9.9  NEUTROABS 9.4*  --   --   --   --   --   --   HGB 7.6*   < > 7.9* 7.6* 9.1* 9.7* 7.8*  HCT 24.6*   < > 24.6* 23.2* 28.1* 29.8* 24.3*  MCV 90.8  --    --  89.2  --  88.4 88.7  PLT 295  --   --  267  --  305 248   < > = values in this interval not displayed.   Basic Metabolic Panel: Recent Labs  Lab 01/30/20 1506 01/31/20 0445 02/01/20 0439  NA 135 139 142  K 4.2 4.2 3.6  CL 100 103 110  CO2 24 25 24   GLUCOSE 134* 113* 110*  BUN 30* 20 9  CREATININE 1.03* 0.93 0.66  CALCIUM 8.7* 8.1* 8.1*   GFR: Estimated Creatinine Clearance: 42.3 mL/min (by C-G formula based on SCr of 0.66 mg/dL). Liver Function Tests: Recent Labs  Lab 01/30/20 1506 01/31/20 0445  AST 16 14*  ALT 22 19  ALKPHOS 92 80  BILITOT 0.5 0.6  PROT 6.2* 5.3*  ALBUMIN 3.1* 2.6*   No results for input(s): LIPASE, AMYLASE in the last 168 hours. No results for input(s): AMMONIA in the last 168 hours. Coagulation Profile: Recent Labs  Lab 01/31/20 0445  INR 1.1   Cardiac Enzymes: No results for input(s): CKTOTAL, CKMB, CKMBINDEX, TROPONINI in the last 168 hours. BNP (last 3 results) No results for input(s): PROBNP in the last 8760 hours. HbA1C: No results for input(s): HGBA1C in the last 72 hours. CBG: No results for input(s): GLUCAP  in the last 168 hours. Lipid Profile: No results for input(s): CHOL, HDL, LDLCALC, TRIG, CHOLHDL, LDLDIRECT in the last 72 hours. Thyroid Function Tests: No results for input(s): TSH, T4TOTAL, FREET4, T3FREE, THYROIDAB in the last 72 hours. Anemia Panel: No results for input(s): VITAMINB12, FOLATE, FERRITIN, TIBC, IRON, RETICCTPCT in the last 72 hours. Urine analysis: No results found for: COLORURINE, APPEARANCEUR, LABSPEC, PHURINE, GLUCOSEU, HGBUR, BILIRUBINUR, KETONESUR, PROTEINUR, UROBILINOGEN, NITRITE, LEUKOCYTESUR Sepsis Labs: @LABRCNTIP (procalcitonin:4,lacticidven:4)  ) Recent Results (from the past 240 hour(s))  SARS Coronavirus 2 by RT PCR (hospital order, performed in [facility] hospital lab) Nasopharyngeal Nasopharyngeal Swab     Status: None   Collection Time: 01/30/20  4:15 PM   Specimen: Nasopharyngeal  Swab  Result Value Ref Range Status   SARS Coronavirus 2 NEGATIVE NEGATIVE Final    Comment: (NOTE) SARS-CoV-2 target nucleic acids are NOT DETECTED.  The SARS-CoV-2 RNA is generally detectable in upper and lower respiratory specimens during the acute phase of infection. The lowest concentration of SARS-CoV-2 viral copies this assay can detect is 250 copies / mL. A negative result does not preclude SARS-CoV-2 infection and should not be used as the sole basis for treatment or other patient management decisions.  A negative result may occur with improper specimen collection / handling, submission of specimen other than nasopharyngeal swab, presence of viral mutation(s) within the areas targeted by this assay, and inadequate number of viral copies (<250 copies / mL). A negative result must be combined with clinical observations, patient history, and epidemiological information.  Fact Sheet for Patients:   StrictlyIdeas.no  Fact Sheet for Healthcare Providers: BankingDealers.co.za  This test is not yet approved or  cleared by the [place] FDA and has been authorized for detection and/or diagnosis of SARS-CoV-2 by FDA under an Emergency Use Authorization (EUA).  This EUA will remain in effect (meaning this test can be used) for the duration of the COVID-19 declaration under Section 564(b)(1) of the Act, 21 U.S.C. section 360bbb-3(b)(1), unless the authorization is terminated or revoked sooner.  Performed at [facility], [place] [address] [place], [place] [address]          Radiology Studies: No results found.      Scheduled Meds: . metoCLOPramide (REGLAN) injection  10 mg Intravenous Once  . [START ON 02/02/2020] pantoprazole  40 mg Oral QAC breakfast   Continuous Infusions: . sodium chloride 20 mL/hr at 02/01/20 0944     LOS: 2 days    Time spent: 14min   Domenic Polite, MD [facility]  02/01/2020, 10:19 AM

## 2020-02-01 NOTE — Op Note (Signed)
[facility] Patient Name: Chelsea Walton Procedure Date : 02/01/2020 MRN: [id number] Attending MD: Gatha Mayer , MD Date of Birth: April 04, 1941 CSN: [id number] Age: 79 Admit Type: Inpatient Procedure:                Colonoscopy Indications:              Melena Providers:                Gatha Mayer, MD, Josie Dixon, RN, Elspeth Cho Tech., Technician, Rejeana Brock, CRNA Referring MD:              Medicines:                Propofol per Anesthesia, Monitored Anesthesia Care Complications:            No immediate complications. Estimated Blood Loss:     Estimated blood loss was minimal. Procedure:                Pre-Anesthesia Assessment:                           - Prior to the procedure, a History and Physical                            was performed, and patient medications and                            allergies were reviewed. The patient's tolerance of                            previous anesthesia was also reviewed. The risks                            and benefits of the procedure and the sedation                            options and risks were discussed with the patient.                            All questions were answered, and informed consent                            was obtained. Prior Anticoagulants: The patient has                            taken no previous anticoagulant or antiplatelet                            agents. ASA Grade Assessment: III - A patient with                            severe systemic disease. After reviewing the risks  and benefits, the patient was deemed in                            satisfactory condition to undergo the procedure.                           After obtaining informed consent, the colonoscope                            was passed under direct vision. Throughout the                            procedure, the patient's blood pressure, pulse, and                             oxygen saturations were monitored continuously. The                            PCF-H190DL ([id number]) Olympus pediatric colonoscope                            was introduced through the anus and advanced to the                            the terminal ileum, with identification of the                            appendiceal orifice and IC valve. The colonoscopy                            was somewhat difficult due to significant looping.                            Successful completion of the procedure was aided by                            applying abdominal pressure. The patient tolerated                            the procedure well. The quality of the bowel                            preparation was good. The terminal ileum, the                            appendiceal orifice and the rectum were                            photographed. Scope In: 8:29:04 AM Scope Out: 8:55:26 AM Scope Withdrawal Time: 0 hours 20 minutes 42 seconds  Total Procedure Duration: 0 hours 26 minutes 22 seconds  Findings:      The perianal and digital rectal examinations were normal.      Two sessile polyps were found in the transverse colon and ascending  colon. The polyps were 1 to 6 mm in size. These polyps were removed with       a cold snare. Resection and retrieval were complete. Verification of       patient identification for the specimen was done. Estimated blood loss       was minimal.      Multiple diverticula were found in the sigmoid colon.      Anal papilla(e) were hypertrophied.      The exam was otherwise without abnormality on direct and retroflexion       views. Impression:               - Two 1 to 6 mm polyps in the transverse colon and                            in the ascending colon, removed with a cold snare.                            Resected and retrieved.                           - Diverticulosis in the sigmoid colon.                           - Anal papilla(e)  were hypertrophied.                           - The examination was otherwise normal on direct                            and retroflexion views. Recommendation:           - Patient has a contact number available for                            emergencies. The signs and symptoms of potential                            delayed complications were discussed with the                            patient. Return to normal activities tomorrow.                            Written discharge instructions were provided to the                            patient.                           - NPO.                           - CAPSULE ENDOSCOPY OF SMALL BOWEL TODAY - AS ? IF                            IT WAS DUODENAL AVM CAUSING BLEED OR  ADDITIONAL/OTHER ABNORMALITIES -                           CBC AGAIN ON RETURN TO FLOOR - ? DILUTIONAL CHANGE                            AFTER 2 U RBC - SHE IS NOT BLEEDING NOW BASED UPON                            COLONOSCOPY FINDINGS                           IF STABLE TOMORROW COULD GO HOME AND WE WILL                            ARRANGE OUTPATIENT F/U - DOES NOT NEED TO WAIT FOR                            CAPSULE READING NECESSARILY - DEPENDING UPON                            CLINICAL COURSE Procedure Code(s):        --- Professional ---                           [phone], Colonoscopy, flexible; with removal of                            tumor(s), polyp(s), or other lesion(s) by snare                            technique Diagnosis Code(s):        --- Professional ---                           K63.5, Polyp of colon                           K62.89, Other specified diseases of anus and rectum                           K92.1, Melena (includes Hematochezia)                           K57.30, Diverticulosis of large intestine without                            perforation or abscess without bleeding CPT copyright 2019 American Medical Association. All  rights reserved. The codes documented in this report are preliminary and upon coder review may  be revised to meet current compliance requirements. Gatha Mayer, MD 02/01/2020 9:17:43 AM This report has been signed electronically. Number of Addenda: 0

## 2020-02-01 NOTE — Anesthesia Procedure Notes (Signed)
Procedure Name: MAC Date/Time: 02/01/2020 8:21 AM Performed by: Inda Coke, CRNA Pre-anesthesia Checklist: Patient identified, Emergency Drugs available, Suction available, Timeout performed and Patient being monitored Patient Re-evaluated:Patient Re-evaluated prior to induction Oxygen Delivery Method: Nasal cannula Induction Type: IV induction Dental Injury: Teeth and Oropharynx as per pre-operative assessment

## 2020-02-01 NOTE — Progress Notes (Signed)
Patient off floor for colonoscopy

## 2020-02-01 NOTE — Progress Notes (Signed)
Patient swallowed capsule with water at 1035 with no problems.  Patient and RN were given copy of instruction and patient verbalized understanding.

## 2020-02-01 NOTE — Interval H&P Note (Signed)
History and Physical Interval Note:  02/01/2020 8:20 AM  Chelsea Walton  has presented today for surgery, with the diagnosis of Melena and anemia.  The various methods of treatment have been discussed with the patient and family. After consideration of risks, benefits and other options for treatment, the patient has consented to  Procedure(s): COLONOSCOPY WITH PROPOFOL (N/A) as a surgical intervention.  The patient's history has been reviewed, patient examined, no change in status, stable for surgery.  I have reviewed the patient's chart and labs.  Questions were answered to the patient's satisfaction.     Silvano Rusk

## 2020-02-01 NOTE — Progress Notes (Signed)
MEWS is yellow, patient is in Endo.

## 2020-02-01 NOTE — Transfer of Care (Signed)
Immediate Anesthesia Transfer of Care Note  Patient: Chelsea Walton  Procedure(s) Performed: COLONOSCOPY WITH PROPOFOL (N/A ) POLYPECTOMY  Patient Location: PACU and Endoscopy Unit  Anesthesia Type:MAC  Level of Consciousness: awake and drowsy  Airway & Oxygen Therapy: Patient Spontanous Breathing  Post-op Assessment: Report given to RN and Post -op Vital signs reviewed and stable  Post vital signs: Reviewed and stable  Last Vitals:  Vitals Value Taken Time  BP 94/61 02/01/20 0900  Temp    Pulse    Resp 21 02/01/20 0900  SpO2    Vitals shown include unvalidated device data.  Last Pain:  Vitals:   02/01/20 0735  TempSrc: Oral  PainSc: 0-No pain         Complications: No complications documented.

## 2020-02-02 ENCOUNTER — Encounter (HOSPITAL_COMMUNITY): Payer: Self-pay | Admitting: Internal Medicine

## 2020-02-02 DIAGNOSIS — K5520 Angiodysplasia of colon without hemorrhage: Secondary | ICD-10-CM

## 2020-02-02 DIAGNOSIS — K922 Gastrointestinal hemorrhage, unspecified: Secondary | ICD-10-CM

## 2020-02-02 DIAGNOSIS — K921 Melena: Principal | ICD-10-CM

## 2020-02-02 LAB — IRON AND TIBC
Iron: 35 ug/dL (ref 28–170)
Saturation Ratios: 12 % (ref 10.4–31.8)
TIBC: 281 ug/dL (ref 250–450)
UIBC: 246 ug/dL

## 2020-02-02 LAB — BASIC METABOLIC PANEL
Anion gap: 8 (ref 5–15)
BUN: 6 mg/dL — ABNORMAL LOW (ref 8–23)
CO2: 25 mmol/L (ref 22–32)
Calcium: 8.2 mg/dL — ABNORMAL LOW (ref 8.9–10.3)
Chloride: 106 mmol/L (ref 98–111)
Creatinine, Ser: 0.79 mg/dL (ref 0.44–1.00)
GFR calc Af Amer: >60 mL/min (ref >60)
GFR calc non Af Amer: >60 mL/min (ref >60)
Glucose, Bld: 112 mg/dL — ABNORMAL HIGH (ref 70–99)
Potassium: 3.3 mmol/L — ABNORMAL LOW (ref 3.5–5.1)
Sodium: 139 mmol/L (ref 135–145)

## 2020-02-02 LAB — CBC
HCT: 24.4 % — ABNORMAL LOW (ref 36.0–46.0)
Hemoglobin: 7.9 g/dL — ABNORMAL LOW (ref 12.0–15.0)
MCH: 29.4 pg (ref 26.0–34.0)
MCHC: 32.4 g/dL (ref 30.0–36.0)
MCV: 90.7 fL (ref 80.0–100.0)
Platelets: 243 10*3/uL (ref 150–400)
RBC: 2.69 MIL/uL — ABNORMAL LOW (ref 3.87–5.11)
RDW: 13.6 % (ref 11.5–15.5)
WBC: 7.4 10*3/uL (ref 4.0–10.5)
nRBC: 0.4 % — ABNORMAL HIGH (ref 0.0–0.2)

## 2020-02-02 LAB — VITAMIN B12: Vitamin B-12: 530 pg/mL (ref 180–914)

## 2020-02-02 LAB — FERRITIN: Ferritin: 127 ng/mL (ref 11–307)

## 2020-02-02 LAB — RETICULOCYTES
Immature Retic Fract: 34.7 % — ABNORMAL HIGH (ref 2.3–15.9)
RBC.: 2.87 MIL/uL — ABNORMAL LOW (ref 3.87–5.11)
Retic Count, Absolute: 108.8 10*3/uL (ref 19.0–186.0)
Retic Ct Pct: 3.8 % — ABNORMAL HIGH (ref 0.4–3.1)

## 2020-02-02 LAB — FOLATE: Folate: 14.9 ng/mL (ref >5.9)

## 2020-02-02 LAB — SURGICAL PATHOLOGY

## 2020-02-02 MED ORDER — FERROUS SULFATE 325 (65 FE) MG PO TBEC: 325.0000 mg | DELAYED_RELEASE_TABLET | Freq: Two times a day (BID) | ORAL | Status: AC

## 2020-02-02 MED ORDER — SODIUM CHLORIDE 0.9 % IV SOLN
510.0000 mg | Freq: Once | INTRAVENOUS | Status: AC
  Administered 2020-02-02: 510 mg via INTRAVENOUS
  Filled 2020-02-02: qty 17

## 2020-02-02 MED ORDER — ASCORBIC ACID 500 MG PO TABS: 250.0000 mg | ORAL_TABLET | Freq: Two times a day (BID) | ORAL | Status: DC

## 2020-02-02 MED ORDER — PANTOPRAZOLE SODIUM 40 MG PO TBEC: 40.0000 mg | DELAYED_RELEASE_TABLET | Freq: Every day | ORAL | 0 refills | Status: DC

## 2020-02-02 MED ORDER — AMLODIPINE BESYLATE 2.5 MG PO TABS: 2.5000 mg | ORAL_TABLET | Freq: Every day | ORAL | Status: AC

## 2020-02-02 MED ORDER — POTASSIUM CHLORIDE CRYS ER 20 MEQ PO TBCR
40.0000 meq | EXTENDED_RELEASE_TABLET | Freq: Once | ORAL | Status: AC
  Administered 2020-02-02: 40 meq via ORAL
  Filled 2020-02-02: qty 2

## 2020-02-02 MED ORDER — VITAMIN C 250 MG PO TABS
250.0000 mg | ORAL_TABLET | Freq: Every day | ORAL | 0 refills | Status: AC
Start: 2020-02-02 — End: ?

## 2020-02-02 MED ORDER — FERROUS SULFATE 325 (65 FE) MG PO TABS: 325.0000 mg | ORAL_TABLET | Freq: Two times a day (BID) | ORAL | Status: DC

## 2020-02-02 MED ORDER — VITAMIN C 250 MG PO TABS
250.0000 mg | ORAL_TABLET | Freq: Every day | ORAL | 0 refills | Status: DC
Start: 2020-02-02 — End: 2020-02-02

## 2020-02-02 NOTE — Addendum Note (Signed)
Addendum  created 02/02/20 1109 by Suzette Battiest, MD   Intraprocedure Staff edited

## 2020-02-02 NOTE — Progress Notes (Addendum)
Daily Rounding Note  02/02/2020, 3:29 PM  LOS: 3 days   SUBJECTIVE:   Chief complaint: Melena, GIB.  Anemia.    Feels ok.  No complaints.       OBJECTIVE:         Vital signs in last 24 hours:    Temp:  [98.1 F (36.7 C)-98.9 F (37.2 C)] 98.3 F (36.8 C) (06/29 1324) Pulse Rate:  [83-101] 83 (06/29 1324) Resp:  [18] 18 (06/29 1324) BP: (117-130)/(73-80) 117/80 (06/29 1324) SpO2:  [94 %-99 %] 99 % (06/29 1324) Last BM Date: 02/01/20 Filed Weights   01/30/20 1454 01/31/20 0915 02/01/20 1023  Weight: 54.4 kg 54.4 kg 53.5 kg   General: pale, comfortable   Heart: RRR Chest: clear bil.   No dyspnea no cough Abdomen: soft, NT, ND.  Active BS  Extremities: no CCE Neuro/Psych:  Alert, oriented x 3.  Good historian.    Intake/Output from previous day: 06/28 0701 - 06/29 0700 In: 1000 [P.O.:600; I.V.:400] Out: 0   Intake/Output this shift: No intake/output data recorded.  Lab Results: Recent Labs    02/01/20 0439 02/01/20 1140 02/02/20 0628  WBC 9.9 10.4 7.4  HGB 7.8* 8.4* 7.9*  HCT 24.3* 26.3* 24.4*  PLT 248 265 243   BMET Recent Labs    01/31/20 0445 02/01/20 0439 02/02/20 0628  NA 139 142 139  K 4.2 3.6 3.3*  CL 103 110 106  CO2 25 24 25   GLUCOSE 113* 110* 112*  BUN 20 9 6*  CREATININE 0.93 0.66 0.79  CALCIUM 8.1* 8.1* 8.2*   LFT Recent Labs    01/31/20 0445  PROT 5.3*  ALBUMIN 2.6*  AST 14*  ALT 19  ALKPHOS 80  BILITOT 0.6   PT/INR Recent Labs    01/31/20 0445  LABPROT 13.7  INR 1.1   Hepatitis Panel No results for input(s): HEPBSAG, HCVAB, HEPAIGM, HEPBIGM in the last 72 hours.  Studies/Results: No results found.   Scheduled Meds:  metoCLOPramide (REGLAN) injection  10 mg Intravenous Once   pantoprazole  40 mg Oral QAC breakfast   Continuous Infusions: PRN Meds:.acetaminophen **OR** acetaminophen, ondansetron (ZOFRAN) IV   ASSESMENT:   *   GI bleed, melena.   Resolved.    6/27 EGD:  Small schatski's ring, small HH, pyloric erythema.  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     6/28 colonscopy: 2 polyps removed (TA w/o HGD).  Sigmoid tics.  Hypertrophic anal papillae.  No source for bleeding 02/02/20 capsule endoscopy: non-bleeding AVMs in SB  *   Normocytic anemia.  Hgb 7.6 >> 9.7 >> 7.9. 2 PRBCs, 6/26 and 6/27.    feraheme this AM.      PLAN   *   Ok to discharge home.  BID oral iron with Vit C 250 mg bid.   Dr Bevelyn Buckles can check CBC in 1 week and then q month to assure no recurrence of signif anemia.    Pt advised to avoid frequent Meloxicam but ok to use a few times a month if needed, tylenol ok.  Protonix 40 mg day or Famotidine 40 mg/day.       Chelsea Walton  02/02/2020, 3:29 PM Phone [phone]   Attending physician's note   I have taken an interval history, reviewed the chart and examined the patient. I agree with the Advanced Practitioner's note, impression and recommendations.   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  Likely source of GI blood loss  Okay to discharge home  Oral iron twice daily  Will need close monitoring of CBC, repeat CBC in 1 week and subsequently monthly  She does not need therapeutic endoscopy unless has evidence of active GI hemorrhage Follow-up with GI next available appointment in 3 to 4 months  K. Denzil Magnuson , MD [phone]

## 2020-02-02 NOTE — Progress Notes (Signed)
PROGRESS NOTE    Chelsea Walton  RWE:[id number] DOB: [birth year] DOA: 01/30/2020 PCP: Leanna Battles, MD  Brief Narrative:  Chelsea Walton is a 79 y.o. female with past medical history of hypertension, dyslipidemia presented to the ED with dark stools and weakness for the past 2  days, h/o NSAID use for hip pain.,  Passed out 2 days ago. 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, treated with cautery, not felt to be sufficient to explain the degree of blood loss, colonoscopy 6/28 with 2 polyps which were removed, completed capsule endoscopy yesterday -Hemoglobin now stable in the 7.9 range, transfused with 2 units of PRBC this admission -Give IV iron x1 now -Await GI input  Syncope -Likely secondary to blood loss and possibly hypotension -No events on telemetry, holding antihypertensives  Essential hypertension -BP stable at this time, hold antihypertensives   dyslipidemia -Continue statin  Code Status: Full code DVT Prophylaxis: SCDs Family Communication:  spouse at bedside  disposition Plan:  Status is: Inpatient  Remains inpatient appropriate because:Inpatient level of care appropriate due to severity of illness   Dispo: The patient is from: Home              Anticipated d/c is to: Home              Anticipated d/c date is: 1-2 days              Patient currently is not medically stable to d/c.  Consultants:   Dr.Hung  Procedures: EGD: 6/27 impression:                                        - Mild Schatzki ring.                           - 2 cm hiatal hernia.                           - Erythematous mucosa in the pylorus.                            - A single non-bleeding angiodysplastic lesion in                             the duodenum. Treated with a monopolar probe.  Colonoscopy 6/28 Impression:               - Two 1 to 6 mm polyps in the transverse colon and                            in the ascending colon, removed with a cold snare.                            Resected and retrieved.                           -  Diverticulosis in the sigmoid colon.                           - Anal papilla(e) were hypertrophied.                           - The examination was otherwise normal on direct                            and retroflexion views.  Antimicrobials:    Subjective: -Feels okay, no bowel movements since yesterday afternoon  Objective: Vitals:   02/01/20 1809 02/01/20 2337 02/02/20 0556 02/02/20 1324  BP: 130/76 117/78 118/73 117/80  Pulse: (!) 101 97 94 83  Resp: 18 18 18 18   Temp: 98.1 F (36.7 C) 98.7 F (37.1 C) 98.9 F (37.2 C) 98.3 F (36.8 C)  TempSrc: Oral Oral Oral Oral  SpO2: 97% 97% 94% 99%  Weight:      Height:        Intake/Output Summary (Last 24 hours) at 02/02/2020 1441 Last data filed at 02/02/2020 0304 Gross per 24 hour  Intake 480 ml  Output --  Net 480 ml   Filed Weights   01/30/20 1454 01/31/20 0915 02/01/20 1023  Weight: 54.4 kg 54.4 kg 53.5 kg    Examination:  General exam: AAOx3, no distress HEENT: No JVD CVS: S1-S2, regular rate rhythm Lungs: Clear bilaterally Abdomen: Soft, nontender, bowel sounds present Extremities: No edema Skin: No rashes on exposed skin  psychiatry: Appropriate mood and affect   Data Reviewed:   CBC: Recent Labs  Lab 01/30/20 1506 01/30/20 2253 01/31/20 0445 01/31/20 0445 01/31/20 1500 01/31/20 1827 02/01/20 0439 02/01/20 1140 02/02/20 0628  WBC 12.6*  --  10.7*  --   --  13.0* 9.9 10.4 7.4  NEUTROABS 9.4*  --   --   --   --   --   --   --   --   HGB 7.6*   < > 7.6*   < > 9.1* 9.7* 7.8* 8.4* 7.9*  HCT 24.6*   < > 23.2*    < > 28.1* 29.8* 24.3* 26.3* 24.4*  MCV 90.8  --  89.2  --   --  88.4 88.7 90.4 90.7  PLT 295  --  267  --   --  305 248 265 243   < > = values in this interval not displayed.   Basic Metabolic Panel: Recent Labs  Lab 01/30/20 1506 01/31/20 0445 02/01/20 0439 02/02/20 0628  NA 135 139 142 139  K 4.2 4.2 3.6 3.3*  CL 100 103 110 106  CO2 24 25 24 25   GLUCOSE 134* 113* 110* 112*  BUN 30* 20 9 6*  CREATININE 1.03* 0.93 0.66 0.79  CALCIUM 8.7* 8.1* 8.1* 8.2*   GFR: Estimated Creatinine Clearance: 44 mL/min (by C-G formula based on SCr of 0.79 mg/dL). Liver Function Tests: Recent Labs  Lab 01/30/20 1506 01/31/20 0445  AST 16 14*  ALT 22 19  ALKPHOS 92 80  BILITOT 0.5 0.6  PROT 6.2* 5.3*  ALBUMIN 3.1* 2.6*   No results for input(s): LIPASE, AMYLASE in the last 168 hours. No results for input(s): AMMONIA in the last 168 hours. Coagulation Profile: Recent Labs  Lab 01/31/20 0445  INR 1.1   Cardiac Enzymes: No results for input(s): CKTOTAL, CKMB, CKMBINDEX, TROPONINI in the  last 168 hours. BNP (last 3 results) No results for input(s): PROBNP in the last 8760 hours. HbA1C: No results for input(s): HGBA1C in the last 72 hours. CBG: No results for input(s): GLUCAP in the last 168 hours. Lipid Profile: No results for input(s): CHOL, HDL, LDLCALC, TRIG, CHOLHDL, LDLDIRECT in the last 72 hours. Thyroid Function Tests: No results for input(s): TSH, T4TOTAL, FREET4, T3FREE, THYROIDAB in the last 72 hours. Anemia Panel: Recent Labs    02/02/20 0733  VITAMINB12 530  FOLATE 14.9  FERRITIN 127  TIBC 281  IRON 35  RETICCTPCT 3.8*   Urine analysis: No results found for: COLORURINE, APPEARANCEUR, LABSPEC, PHURINE, GLUCOSEU, HGBUR, BILIRUBINUR, KETONESUR, PROTEINUR, UROBILINOGEN, NITRITE, LEUKOCYTESUR Sepsis Labs: @LABRCNTIP (procalcitonin:4,lacticidven:4)  ) Recent Results (from the past 240 hour(s))  SARS Coronavirus 2 by RT PCR (hospital order, performed in [facility] hospital lab) Nasopharyngeal Nasopharyngeal Swab     Status: None   Collection Time: 01/30/20  4:15 PM   Specimen: Nasopharyngeal Swab  Result Value Ref Range Status   SARS Coronavirus 2 NEGATIVE NEGATIVE Final    Comment: (NOTE) SARS-CoV-2 target nucleic acids are NOT DETECTED.  The SARS-CoV-2 RNA is generally detectable in upper and lower respiratory specimens during the acute phase of infection. The lowest concentration of SARS-CoV-2 viral copies this assay can detect is 250 copies / mL. A negative result does not preclude SARS-CoV-2 infection and should not be used as the sole basis for treatment or other patient management decisions.  A negative result may occur with improper specimen collection / handling, submission of specimen other than nasopharyngeal swab, presence of viral mutation(s) within the areas targeted by this assay, and inadequate number of viral copies (<250 copies / mL). A negative result must be combined with clinical observations, patient history, and epidemiological information.  Fact Sheet for Patients:   StrictlyIdeas.no  Fact Sheet for Healthcare Providers: BankingDealers.co.za  This test is not yet approved or  cleared by the [place] FDA and has been authorized for detection and/or diagnosis of SARS-CoV-2 by FDA under an Emergency Use Authorization (EUA).  This EUA will remain in effect (meaning this test can be used) for the duration of the COVID-19 declaration under Section 564(b)(1) of the Act, 21 U.S.C. section 360bbb-3(b)(1), unless the authorization is terminated or revoked sooner.  Performed at [facility], [place] [address], [place], [place] [address]     Radiology Studies: No results found.  Scheduled Meds: . metoCLOPramide (REGLAN) injection  10 mg Intravenous Once  . pantoprazole  40 mg Oral QAC breakfast   Continuous Infusions:    LOS: 3 days    Time spent: 61min    Domenic Polite, MD [facility]  02/02/2020, 2:41 PM

## 2020-02-02 NOTE — Progress Notes (Signed)
Discharge instructions reviewed with pt and her husband.  Copy of instructions and scripts given to pt.  At 1810 Pt d/c'd via wheelchair with belongings, with husband.             Escorted by unit staff.

## 2020-02-03 ENCOUNTER — Encounter (HOSPITAL_COMMUNITY): Payer: Self-pay | Admitting: Internal Medicine

## 2020-02-03 ENCOUNTER — Encounter: Payer: Self-pay | Admitting: Physician Assistant

## 2020-02-04 ENCOUNTER — Encounter: Payer: Self-pay | Admitting: Internal Medicine

## 2020-02-11 DIAGNOSIS — M25551 Pain in right hip: Secondary | ICD-10-CM | POA: Diagnosis not present

## 2020-02-11 DIAGNOSIS — M25552 Pain in left hip: Secondary | ICD-10-CM | POA: Diagnosis not present

## 2020-02-12 DIAGNOSIS — E785 Hyperlipidemia, unspecified: Secondary | ICD-10-CM | POA: Diagnosis not present

## 2020-02-12 DIAGNOSIS — R55 Syncope and collapse: Secondary | ICD-10-CM | POA: Diagnosis not present

## 2020-02-12 DIAGNOSIS — D62 Acute posthemorrhagic anemia: Secondary | ICD-10-CM | POA: Diagnosis not present

## 2020-02-12 DIAGNOSIS — K921 Melena: Secondary | ICD-10-CM | POA: Diagnosis not present

## 2020-02-12 DIAGNOSIS — I10 Essential (primary) hypertension: Secondary | ICD-10-CM | POA: Diagnosis not present

## 2020-02-25 NOTE — Discharge Summary (Signed)
Physician Discharge Summary  KIHANNA KAMIYA VPX:[id number] DOB: [birth year] DOA: 01/30/2020  PCP: Leanna Battles, MD  Admit date: 01/30/2020 Discharge date: 02/02/2020  Time spent: 35 minutes  Recommendations for Outpatient Follow-up:  1. Dr.Daniel Sharlett Iles in 2-3weeks 2. PCP in 1 week, please check CBC in 1 week and then Monthly   Discharge Diagnoses:  Active Problems:   Melena   Acute blood loss anemia   Essential hypertension   Dyslipidemia   Symptomatic anemia   Benign neoplasm of ascending colon   Benign neoplasm of transverse colon   Acute GI bleeding   AVM (arteriovenous malformation) of small bowel, acquired   Discharge Condition: stable  Diet recommendation: low sodium  Filed Weights   01/30/20 1454 01/31/20 0915 02/01/20 1023  Weight: 54.4 kg 54.4 kg 53.5 kg    History of present illness:  Chelsea Santo Lamkinsis a 79 y.o.femalewith past medical history of hypertension, dyslipidemia presented to the ED with dark stools and weakness for the past 2 days, h/o NSAID use for hip pain.,  Passed out 2 days ago. 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, treated with cautery, not felt to be sufficient to explain the degree of blood loss, colonoscopy 6/28 with 2 polyps which were removed, completed capsule endoscopy yesterday -Hemoglobin now stable in the 7.9 range, transfused with 2 units of PRBC this admission -Give IV iron x1  -Hemoglobin stable since, GI recommends FU with Dr.Patterson and CBC check in 1 week and then Qmonthly  Syncope -Likely secondary to blood loss and possibly hypotension -No events on telemetry, stopped some BP meds  at DC  Essential hypertension -BP stable at this time, held HCTZ and ARb at discharge, resume slowly at FU  dyslipidemia -Continue statin  Consultants:   Dr.Hung  Procedures: EGD: 6/27 impression:                          - Mild Schatzki ring. - 2 cm hiatal hernia. - Erythematous mucosa in the pylorus. - A single non-bleeding angiodysplastic lesion in   the duodenum. Treated with a monopolar probe.  Colonoscopy 6/28 Impression: - Two 1 to 6 mm polyps in the transverse colon and  in the ascending colon, removed with a cold snare.  Resected and retrieved. - Diverticulosis in the sigmoid colon. - Anal papilla(e) were hypertrophied. - The examination was otherwise normal on direct  and retroflexion views.    Discharge Exam: Vitals:   02/02/20 0556 02/02/20 1324  BP: 118/73 117/80  Pulse: 94 83  Resp: 18 18  Temp: 98.9 F (37.2 C) 98.3 F (36.8 C)  SpO2: 94% 99%    General: AAOx3 Cardiovascular: S1S2/RRR Respiratory: CTAB  Discharge Instructions   Discharge Instructions    Diet - low sodium heart healthy   Complete by: As directed    Increase activity slowly   Complete by: As directed      Allergies as of 02/02/2020      Reactions   Meloxicam Other (See Comments)   Intestinal bleeding    Tribenzor [olmesartan-amlodipine-hctz] Nausea And Vomiting, Other (See Comments)   Smell and taste perversion (could not even tolerate "the smell  of water")   Metoprolol Nausea And Vomiting, Other (See Comments)   Body aches and chills, also      Medication List    STOP taking these medications   hydrochlorothiazide 12.5 MG capsule Commonly known as: MICROZIDE   losartan 100 MG  tablet Commonly known as: COZAAR     TAKE these medications   acetaminophen 500 MG tablet Commonly known as: TYLENOL Take 1,000 mg by mouth every 6 (six) hours as needed (for pain).   amLODipine 2.5 MG tablet Commonly known as: NORVASC Take 1 tablet (2.5 mg total) by mouth daily. What changed: when to take this   doxylamine (Sleep) 25 MG tablet Commonly known as: UNISOM Take 25 mg by mouth at bedtime.   ferrous sulfate 325 (65 FE) MG EC tablet Take 1 tablet (325 mg total) by mouth 2 (two) times daily with a meal. What changed:   when to take this  reasons to take this   gabapentin 300 MG capsule Commonly known as: NEURONTIN Take 300 mg by mouth daily as needed (for hip pain).   ibandronate 150 MG tablet Commonly known as: BONIVA Take 150 mg by mouth every 30 (thirty) days. Take in the morning with a full glass of water, on an empty stomach, and do not take anything else by mouth or lie down for the next 30 min.   pantoprazole 40 MG tablet Commonly known as: PROTONIX Take 1 tablet (40 mg total) by mouth daily before breakfast.   pravastatin 80 MG tablet Commonly known as: PRAVACHOL Take 80 mg by mouth daily.   vitamin C 250 MG tablet Commonly known as: ASCORBIC ACID Take 1 tablet (250 mg total) by mouth daily.      Allergies  Allergen Reactions  . Meloxicam Other (See Comments)    Intestinal bleeding   . Tribenzor [Olmesartan-Amlodipine-Hctz] Nausea And Vomiting and Other (See Comments)    Smell and taste perversion (could not even tolerate "the smell of water")  . Metoprolol Nausea And Vomiting and Other (See Comments)    Body aches and chills, also    Follow-up Information    Leanna Battles, MD. Schedule an appointment as soon as possible for a visit in 1 week(s).   Specialty: Internal Medicine Contact information: [address] [place] [address] [phone]        [facility]. Schedule an appointment as soon as possible for a visit in 2  week(s).                The results of significant diagnostics from this hospitalization (including imaging, microbiology, ancillary and laboratory) are listed below for reference.    Significant Diagnostic Studies: No results found.  Microbiology: No results found for this or any previous visit (from the past 240 hour(s)).   Labs: Basic Metabolic Panel: No results for input(s): NA, K, CL, CO2, GLUCOSE, BUN, CREATININE, CALCIUM, MG, PHOS in the last 168 hours. Liver Function Tests: No results for input(s): AST, ALT, ALKPHOS, BILITOT, PROT, ALBUMIN in the last 168 hours. No results for input(s): LIPASE, AMYLASE in the last 168 hours. No results for input(s): AMMONIA in the last 168 hours. CBC: No results for input(s): WBC, NEUTROABS, HGB, HCT, MCV, PLT in the last 168 hours. Cardiac Enzymes: No results for input(s): CKTOTAL, CKMB, CKMBINDEX, TROPONINI in the last 168 hours. BNP: BNP (last 3 results) No results for input(s): BNP in the last 8760 hours.  ProBNP (last 3 results) No results for input(s): PROBNP in the last  8760 hours.  CBG: No results for input(s): GLUCAP in the last 168 hours.     Signed:  Domenic Polite MD.  [facility] 02/25/2020, 7:37 AM

## 2020-03-07 ENCOUNTER — Telehealth: Payer: Self-pay | Admitting: Internal Medicine

## 2020-03-07 ENCOUNTER — Ambulatory Visit: Payer: Medicare HMO | Admitting: Physician Assistant

## 2020-03-07 NOTE — Telephone Encounter (Signed)
Please advise Sir, thank you. 

## 2020-03-07 NOTE — Telephone Encounter (Signed)
Go ahead with cortisone injection I do not think it will be a problem for the AVM's

## 2020-03-07 NOTE — Telephone Encounter (Signed)
I spoke with Chelsea Walton and she said they want to give her a cortisone injection for her hip pain caused by lumbar issue. She goggled it and saw that it could cause a bled.  She said she has a history of AVM' s in her small intestines. She just wanted to double check before doing so Sir. Would it be to risky to do the cortisone injection?

## 2020-03-07 NOTE — Telephone Encounter (Signed)
Patient returned your call.

## 2020-03-07 NOTE — Telephone Encounter (Signed)
Patient informed and said thank you. 

## 2020-03-07 NOTE — Telephone Encounter (Signed)
Do not think so

## 2020-03-07 NOTE — Telephone Encounter (Signed)
Left patient detailed message with the answer to her question.

## 2020-03-07 NOTE — Telephone Encounter (Signed)
Patient has questions on Protonix medication and cortisone injections. She is wondering if they could be the cause of intestinal bleeding.

## 2020-03-15 DIAGNOSIS — D62 Acute posthemorrhagic anemia: Secondary | ICD-10-CM | POA: Diagnosis not present

## 2020-04-01 DIAGNOSIS — M25552 Pain in left hip: Secondary | ICD-10-CM | POA: Diagnosis not present

## 2020-04-01 DIAGNOSIS — M25551 Pain in right hip: Secondary | ICD-10-CM | POA: Diagnosis not present

## 2020-04-05 DIAGNOSIS — M7062 Trochanteric bursitis, left hip: Secondary | ICD-10-CM | POA: Diagnosis not present

## 2020-04-05 DIAGNOSIS — M25551 Pain in right hip: Secondary | ICD-10-CM | POA: Diagnosis not present

## 2020-04-05 DIAGNOSIS — M7061 Trochanteric bursitis, right hip: Secondary | ICD-10-CM | POA: Diagnosis not present

## 2020-04-05 DIAGNOSIS — M25552 Pain in left hip: Secondary | ICD-10-CM | POA: Diagnosis not present

## 2020-04-05 NOTE — Progress Notes (Addendum)
04/05/2020 QUINCI GAVIDIA [id number] November 20, 1940   Chief Complaint: Small intestinal bleeding   History of Present Illness: Chelsea Walton is a 79 year old female with a past medical history of hypertension and hyperlipidemia. She was admitted to the [facility] on 01/30/2020 via EMS with symptomatic anemia,  bright rectal bleeding and melena with associated syncope. No prior history of GI bleed. She took Meloxicam due to having hip pain for 2 weeks prior to her hospital admission. She previously took Ibuprofen for neck and shoulder pain. Her admission Hg 7.6g (Hg 15.9 Jan. 2019) . She received 2 units of PRBCs and Feraheme IV x 1. She underwent an EGD 6/27 which showed a mild Schatzki's ring, a 2 cm hiatal hernia, erythema in the pylorus and a single nonbleeding angioplastic lesion in the duodenum treated with a monopolar probe.  A colonoscopy was done 6/28 which identified 2 polyps to the transverse and ascending colon, diverticulosis in the sigmoid colon and anal papillae hypertrophy.  A small bowel capsule endoscopy was done 6/30 which identified 3-4 small AVMs, one AVM was minutes beyond the first duodenal image and the other AVMs were likely too distal to be approached by enteroscopy.  Her hemoglobin stabilized and she did not demonstrate any further evidence of active GI bleeding. She was discharged home on 02/02/2020 on Protonix 40 mg once daily and Ferrous Sulfate 325 mg twice daily.  She presents today for further GI evaluation.  He denies having any dysphagia, heartburn or upper abdominal pain.  No lower abdominal pain.  She is passing a darker brown formed bowel movement every day.  No black stools.  No further rectal bleeding.  She was having significant right and left hip and groin pain.  She was evaluated by Ortho who initially thought she had sciatica.  She underwent hip MRI which showed evidence of bursitis, arthritis and tendinitis.  She received a cortisone injection to both hips  yesterday and her pain has significantly improved.  She contacted Dr. Carlean Purl prior to receiving a cortisone injection with concerns of receiving a steroid in the setting of a recent GI bleed.  Dr. Carlean Purl verified it was okay for her to receive a cortisone injection.  She underwent repeat laboratory studies by her PCP Dr. Sharlett Iles 3 weeks ago, she stated her hemoglobin was normal at that time.   EGD 01/31/2020 by Dr. Benson Norway: - Mild Schatzki ring. - 2 cm hiatal hernia. - Erythematous mucosa in the pylorus. - A single non-bleeding angiodysplastic lesion in the duodenum. Treated with a monopolar   probe. - No specimens collected.  Colonoscopy 6/28/20201 by Dr. Carlean Purl: - Two 1 to 6 mm tubular adenomatous polyps in the transverse colon and in the ascending colon, removed with a   cold snare. Resected and retrieved. - Diverticulosis in the sigmoid colon. - Anal papilla(e) were hypertrophied. - The examination was otherwise normal on direct and retroflexion views.  Small bowel capsule endoscopy 02/03/2020: Complete capsule study, no active bleeding There are 3-4 small AVMs noted -the most proximal lesion is at 1 hour 31 minutes which is minutes beyond first duodenal image,  the other angiectasia visualized are likely too distal to be approached via traditional enteroscopy.  Current Outpatient Medications on File Prior to Visit  Medication Sig Dispense Refill  . acetaminophen (TYLENOL) 500 MG tablet Take 1,000 mg by mouth every 6 (six) hours as needed (for pain).    Marland Kitchen amLODipine (NORVASC) 2.5 MG tablet Take 1 tablet (2.5 mg  total) by mouth daily.    Marland Kitchen doxylamine, Sleep, (UNISOM) 25 MG tablet Take 25 mg by mouth at bedtime.    . ferrous sulfate 325 (65 FE) MG EC tablet Take 1 tablet (325 mg total) by mouth 2 (two) times daily with a meal.    . gabapentin (NEURONTIN) 300 MG capsule Take 300 mg by mouth daily as needed (for hip pain).    . ibandronate (BONIVA) 150 MG tablet Take 150 mg by mouth  every 30 (thirty) days. Take in the morning with a full glass of water, on an empty stomach, and do not take anything else by mouth or lie down for the next 30 min.    . pantoprazole (PROTONIX) 40 MG tablet Take 1 tablet (40 mg total) by mouth daily before breakfast. 30 tablet 0  . pravastatin (PRAVACHOL) 80 MG tablet Take 80 mg by mouth daily.  90 tablet 3  . vitamin C (ASCORBIC ACID) 250 MG tablet Take 1 tablet (250 mg total) by mouth daily. 30 tablet 0   No current facility-administered medications on file prior to visit.    Allergies  Allergen Reactions  . Meloxicam Other (See Comments)    Intestinal bleeding   . Tribenzor [Olmesartan-Amlodipine-Hctz] Nausea And Vomiting and Other (See Comments)    Smell and taste perversion (could not even tolerate "the smell of water")  . Metoprolol Nausea And Vomiting and Other (See Comments)    Body aches and chills, also     Current Medications, Allergies, Past Medical History, Past Surgical History, Family History and Social History were reviewed in [employer] record.   Physical Exam: BP 110/70   Pulse 64   Ht 4\' 11"  (1.499 m)   Wt 117 lb 12.8 oz (53.4 kg)   BMI 23.79 kg/m   General: Well developed 79 year old female in no acute distress. Head: Normocephalic and atraumatic. Eyes: No scleral icterus. Conjunctiva pink . Ears: Normal auditory acuity. Mouth: Dentition intact. No ulcers or lesions.  Lungs: Clear throughout to auscultation. Heart: Regular rate and rhythm, no murmur. Abdomen: Soft, nontender and nondistended. No masses or hepatomegaly. Normal bowel sounds x 4 quadrants.  Rectal: Deferred. Musculoskeletal: Symmetrical with no gross deformities. Extremities: No edema. Neurological: Alert oriented x 4. No focal deficits.  Psychological: Alert and cooperative. Normal mood and affect  Assessment and Recommendations: 54.  79 year old female admitted to the [facility] 01/30/2020 with rectal bleeding, melenic  stool (described as a solid black stool x 1) with syncope. 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  Admission Hg 7.6. She received 2 units of PRBCs. Feraheme IV x 1. EGD showed a HH, erythema at the pylorus, and a single nonbleeding duodenal AVM treated with a monopolar probe. The colonoscopy identified 1 tubular adenomatous polyp and sigmoid diverticulosis without evidence of bleeding.  A small bowel capsule endoscopy showed 3-4 small AVMs noted, the most proximal lesion is at 1 hour 31 minutes which is minutes beyond first duodenal image,  the other angiectasia visualized are likely too distal to be approached via traditional enteroscopy. -CBC, iron, iron saturation, TIBC and ferritin level -No NSAIDs -Continue Pantoprazole 40 mg once daily for now -Continue Ferrous Sulfate 325 mg 1 p.o. twice daily for now, further instructions after the above lab results received -Patient will call our office if she develops any rectal bleeding or black stools -? Small bowel double balloon enteroscopy if she demonstrates signs of active bleeding  2.  History  of tubular adenomatous colon polyps -Dr. Carlean Purl to verify if any further colonoscopies to be completed as the patient is now 79 years old  Agree with the plan overall.  Supportive care with iron therapy.  She is not a candidate for recall routine colonoscopy given her age and overall findings.  We do not have double-balloon enteroscopy but we do have single balloon enteroscopy that Dr. Bryan Lemma performs and that is an option if we would need to try to evaluate again and treat suspected bleeding angiodysplasia of the small intestine.  She needs aggressive regular follow-up of her CBC and ferritin levels through primary care and chronic iron therapy.   Gatha Mayer, MD, [facility]

## 2020-04-06 ENCOUNTER — Encounter: Payer: Self-pay | Admitting: Nurse Practitioner

## 2020-04-06 ENCOUNTER — Ambulatory Visit: Payer: Medicare HMO | Admitting: Nurse Practitioner

## 2020-04-06 ENCOUNTER — Other Ambulatory Visit (INDEPENDENT_AMBULATORY_CARE_PROVIDER_SITE_OTHER): Payer: Medicare HMO

## 2020-04-06 VITALS — BP 110/70 | HR 64 | Ht 59.0 in | Wt 117.8 lb

## 2020-04-06 DIAGNOSIS — D509 Iron deficiency anemia, unspecified: Secondary | ICD-10-CM | POA: Diagnosis not present

## 2020-04-06 DIAGNOSIS — K5520 Angiodysplasia of colon without hemorrhage: Secondary | ICD-10-CM

## 2020-04-06 LAB — CBC
HCT: 37.8 % (ref 36.0–46.0)
Hemoglobin: 12.4 g/dL (ref 12.0–15.0)
MCHC: 32.8 g/dL (ref 30.0–36.0)
MCV: 85.3 fl (ref 78.0–100.0)
Platelets: 505 10*3/uL — ABNORMAL HIGH (ref 150.0–400.0)
RBC: 4.44 Mil/uL (ref 3.87–5.11)
RDW: 14.3 % (ref 11.5–15.5)
WBC: 14.5 10*3/uL — ABNORMAL HIGH (ref 4.0–10.5)

## 2020-04-06 LAB — FERRITIN: Ferritin: 251 ng/mL (ref 10.0–291.0)

## 2020-04-06 LAB — IBC PANEL
Iron: 51 ug/dL (ref 42–145)
Saturation Ratios: 16.7 % — ABNORMAL LOW (ref 20.0–50.0)
Transferrin: 218 mg/dL (ref 212.0–360.0)

## 2020-04-06 NOTE — Patient Instructions (Addendum)
If you are age 78 or older, your body mass index should be between 23-30. Your Body mass index is 23.79 kg/m. If this is out of the aforementioned range listed, please consider follow up with your Primary Care Provider.  If you are age 86 or younger, your body mass index should be between 19-25. Your Body mass index is 23.79 kg/m. If this is out of the aformentioned range listed, please consider follow up with your Primary Care Provider.   1. Continue taking Protonix 40mg  daily for now. 2. Continue taking your Ferrous Sulfate 1 tablet twice daily. 3. Further instruction will come after lab results are reviewed.  Due to recent changes in healthcare laws, you may see the results of your imaging and laboratory studies on MyChart before your provider has had a chance to review them.  We understand that in some cases there may be results that are confusing or concerning to you. Not all laboratory results come back in the same time frame and the provider may be waiting for multiple results in order to interpret others.  Please give [place] 48 hours in order for your provider to thoroughly review all the results before contacting the office for clarification of your results.   Thank you for choosing [facility] Noralyn Pick, CRNP

## 2020-04-07 LAB — IRON, TOTAL/TOTAL IRON BINDING CAP
%SAT: 15 % (calc) — ABNORMAL LOW (ref 16–45)
Iron: 43 ug/dL — ABNORMAL LOW (ref 45–160)
TIBC: 294 mcg/dL (calc) (ref 250–450)

## 2020-04-13 DIAGNOSIS — M25551 Pain in right hip: Secondary | ICD-10-CM | POA: Diagnosis not present

## 2020-04-13 DIAGNOSIS — M25552 Pain in left hip: Secondary | ICD-10-CM | POA: Diagnosis not present

## 2020-04-18 ENCOUNTER — Telehealth: Payer: Self-pay | Admitting: General Surgery

## 2020-04-18 DIAGNOSIS — D509 Iron deficiency anemia, unspecified: Secondary | ICD-10-CM

## 2020-04-18 DIAGNOSIS — M25551 Pain in right hip: Secondary | ICD-10-CM | POA: Diagnosis not present

## 2020-04-18 DIAGNOSIS — K5520 Angiodysplasia of colon without hemorrhage: Secondary | ICD-10-CM

## 2020-04-18 DIAGNOSIS — M25552 Pain in left hip: Secondary | ICD-10-CM | POA: Diagnosis not present

## 2020-04-18 NOTE — Telephone Encounter (Signed)
-----   Message from Noralyn Pick, NP sent at 04/18/2020  8:02 AM EDT ----- Olivia Mackie, pls contact the patient. She needs to follow up with her PCP regarding her elevated WBC and platelet count.   Her iron level was done twice on 9/1 with iron level 51 and 43. Ferritin was ok.   Pls have her continue Ferrous sulfate 325mg  one po bid.  Repeat CBC, iron, iron saturation, TIBC and Ferritin level in 4 weeks. thx

## 2020-04-18 NOTE — Telephone Encounter (Signed)
Spoke with the patient and gave her all lab results. Expressed to her she needs to contact her PCP regarding elevated WBC and platelets. She was informed of her Iron levels and was asked to remain on her iron supplement and repeat her labs in 4 weeks, 05/04/2020. The patient verbalized understanding and repeat lab orders were placed.

## 2020-04-20 DIAGNOSIS — I10 Essential (primary) hypertension: Secondary | ICD-10-CM | POA: Diagnosis not present

## 2020-04-21 DIAGNOSIS — M25551 Pain in right hip: Secondary | ICD-10-CM | POA: Diagnosis not present

## 2020-04-21 DIAGNOSIS — M25552 Pain in left hip: Secondary | ICD-10-CM | POA: Diagnosis not present

## 2020-04-26 DIAGNOSIS — M25552 Pain in left hip: Secondary | ICD-10-CM | POA: Diagnosis not present

## 2020-04-26 DIAGNOSIS — M25551 Pain in right hip: Secondary | ICD-10-CM | POA: Diagnosis not present

## 2020-04-27 DIAGNOSIS — H524 Presbyopia: Secondary | ICD-10-CM | POA: Diagnosis not present

## 2020-04-27 DIAGNOSIS — H5203 Hypermetropia, bilateral: Secondary | ICD-10-CM | POA: Diagnosis not present

## 2020-04-27 DIAGNOSIS — H2511 Age-related nuclear cataract, right eye: Secondary | ICD-10-CM | POA: Diagnosis not present

## 2020-04-27 DIAGNOSIS — Z961 Presence of intraocular lens: Secondary | ICD-10-CM | POA: Diagnosis not present

## 2020-04-28 DIAGNOSIS — D62 Acute posthemorrhagic anemia: Secondary | ICD-10-CM | POA: Diagnosis not present

## 2020-04-29 DIAGNOSIS — M25552 Pain in left hip: Secondary | ICD-10-CM | POA: Diagnosis not present

## 2020-04-29 DIAGNOSIS — M25551 Pain in right hip: Secondary | ICD-10-CM | POA: Diagnosis not present

## 2020-05-03 DIAGNOSIS — M25552 Pain in left hip: Secondary | ICD-10-CM | POA: Diagnosis not present

## 2020-05-03 DIAGNOSIS — M25551 Pain in right hip: Secondary | ICD-10-CM | POA: Diagnosis not present

## 2020-05-04 NOTE — Telephone Encounter (Signed)
Pt call to inform that she had her blood test with her PCP and it came back normal. Her PCP told her that the 2 cortisone injections that she had the day before her first blood work elevated her blood count. Pt wanted Colleen to be aware of that.

## 2020-05-06 DIAGNOSIS — M25552 Pain in left hip: Secondary | ICD-10-CM | POA: Diagnosis not present

## 2020-05-06 DIAGNOSIS — M25551 Pain in right hip: Secondary | ICD-10-CM | POA: Diagnosis not present

## 2020-05-09 DIAGNOSIS — M25552 Pain in left hip: Secondary | ICD-10-CM | POA: Diagnosis not present

## 2020-05-09 DIAGNOSIS — M545 Low back pain, unspecified: Secondary | ICD-10-CM | POA: Insufficient documentation

## 2020-05-09 DIAGNOSIS — M25551 Pain in right hip: Secondary | ICD-10-CM | POA: Diagnosis not present

## 2020-05-12 DIAGNOSIS — M25552 Pain in left hip: Secondary | ICD-10-CM | POA: Diagnosis not present

## 2020-05-12 DIAGNOSIS — M25551 Pain in right hip: Secondary | ICD-10-CM | POA: Diagnosis not present

## 2020-05-21 DIAGNOSIS — Z23 Encounter for immunization: Secondary | ICD-10-CM | POA: Diagnosis not present

## 2020-05-25 DIAGNOSIS — M5416 Radiculopathy, lumbar region: Secondary | ICD-10-CM | POA: Diagnosis not present

## 2020-05-26 ENCOUNTER — Telehealth: Payer: Self-pay | Admitting: Nurse Practitioner

## 2020-05-26 ENCOUNTER — Other Ambulatory Visit: Payer: Self-pay

## 2020-05-26 MED ORDER — PANTOPRAZOLE SODIUM 40 MG PO TBEC: 40.0000 mg | DELAYED_RELEASE_TABLET | Freq: Every day | ORAL | 1 refills | Status: AC

## 2020-05-26 NOTE — Telephone Encounter (Signed)
What can she safely take for her hip pain? She is on the PPI indefinitely, correct?

## 2020-05-26 NOTE — Telephone Encounter (Signed)
Discuss with the patient. She understands the plan and the reasons. Refill to the [facility].

## 2020-05-26 NOTE — Telephone Encounter (Signed)
Chelsea Walton pls refill her Pantoprazole 40mg  QD # 90, 1 additional refill. Pt to continue indefinitely.  She can take Tylenol 500mg  2 tabs bid, really needs to avoid NSAIDS. Steroid injections for her hips as needed is ok.  She needs to follow up with her PCP if she is having muscle pains.

## 2020-05-26 NOTE — Telephone Encounter (Signed)
Pt wants to know if she should continue taking protonix as she is running low of prescription. She also needs to know if she can take buprofen for muscle pain.

## 2020-06-02 DIAGNOSIS — M5416 Radiculopathy, lumbar region: Secondary | ICD-10-CM | POA: Diagnosis not present

## 2020-06-08 DIAGNOSIS — M5416 Radiculopathy, lumbar region: Secondary | ICD-10-CM | POA: Diagnosis not present

## 2020-06-10 ENCOUNTER — Other Ambulatory Visit (HOSPITAL_BASED_OUTPATIENT_CLINIC_OR_DEPARTMENT_OTHER): Payer: Self-pay | Admitting: Internal Medicine

## 2020-06-10 ENCOUNTER — Ambulatory Visit: Payer: Medicare HMO | Attending: Internal Medicine

## 2020-06-10 DIAGNOSIS — M5416 Radiculopathy, lumbar region: Secondary | ICD-10-CM | POA: Diagnosis not present

## 2020-06-10 DIAGNOSIS — Z23 Encounter for immunization: Secondary | ICD-10-CM

## 2020-06-10 NOTE — Progress Notes (Signed)
   Covid-19 Vaccination Clinic  Name:  Chelsea Walton    MRN: [id number] DOB: 1941/03/30  06/10/2020  [name] was observed post Covid-19 immunization for 15 minutes without incident. She was provided with Vaccine Information Sheet and instruction to access the V-Safe system.   Vaccinated by Unknown Foley  [name] was instructed to call 911 with any severe reactions post vaccine: Marland Kitchen Difficulty breathing  . Swelling of face and throat  . A fast heartbeat  . A bad rash all over body  . Dizziness and weakness

## 2020-06-13 DIAGNOSIS — M5416 Radiculopathy, lumbar region: Secondary | ICD-10-CM | POA: Diagnosis not present

## 2020-06-15 DIAGNOSIS — M5416 Radiculopathy, lumbar region: Secondary | ICD-10-CM | POA: Diagnosis not present

## 2020-06-16 MED FILL — PFIZER-BIONTECH COVID-19 VA: 30 | 1 days supply | Qty: 0 | Fill #0

## 2020-06-22 DIAGNOSIS — M25512 Pain in left shoulder: Secondary | ICD-10-CM | POA: Diagnosis not present

## 2020-06-22 DIAGNOSIS — M25562 Pain in left knee: Secondary | ICD-10-CM | POA: Diagnosis not present

## 2020-06-22 DIAGNOSIS — M25511 Pain in right shoulder: Secondary | ICD-10-CM | POA: Diagnosis not present

## 2020-06-22 DIAGNOSIS — M25561 Pain in right knee: Secondary | ICD-10-CM | POA: Diagnosis not present

## 2020-06-23 DIAGNOSIS — E785 Hyperlipidemia, unspecified: Secondary | ICD-10-CM | POA: Diagnosis not present

## 2020-06-23 DIAGNOSIS — M25559 Pain in unspecified hip: Secondary | ICD-10-CM | POA: Diagnosis not present

## 2020-06-23 DIAGNOSIS — M130 Polyarthritis, unspecified: Secondary | ICD-10-CM | POA: Diagnosis not present

## 2020-07-07 DIAGNOSIS — M25512 Pain in left shoulder: Secondary | ICD-10-CM | POA: Diagnosis not present

## 2020-07-07 DIAGNOSIS — M25511 Pain in right shoulder: Secondary | ICD-10-CM | POA: Diagnosis not present

## 2020-07-16 DIAGNOSIS — M25511 Pain in right shoulder: Secondary | ICD-10-CM | POA: Diagnosis not present

## 2020-07-21 DIAGNOSIS — D481 Neoplasm of uncertain behavior of connective and other soft tissue: Secondary | ICD-10-CM | POA: Diagnosis not present

## 2020-07-21 DIAGNOSIS — M25511 Pain in right shoulder: Secondary | ICD-10-CM | POA: Diagnosis not present

## 2020-08-09 DIAGNOSIS — R5383 Other fatigue: Secondary | ICD-10-CM | POA: Diagnosis not present

## 2020-08-09 DIAGNOSIS — M810 Age-related osteoporosis without current pathological fracture: Secondary | ICD-10-CM | POA: Diagnosis not present

## 2020-08-09 DIAGNOSIS — M2550 Pain in unspecified joint: Secondary | ICD-10-CM | POA: Diagnosis not present

## 2020-08-09 DIAGNOSIS — M064 Inflammatory polyarthropathy: Secondary | ICD-10-CM | POA: Diagnosis not present

## 2020-08-09 DIAGNOSIS — Z6823 Body mass index (BMI) 23.0-23.9, adult: Secondary | ICD-10-CM | POA: Diagnosis not present

## 2020-08-19 DIAGNOSIS — E785 Hyperlipidemia, unspecified: Secondary | ICD-10-CM | POA: Diagnosis not present

## 2020-08-19 DIAGNOSIS — M810 Age-related osteoporosis without current pathological fracture: Secondary | ICD-10-CM | POA: Diagnosis not present

## 2020-08-26 DIAGNOSIS — M1612 Unilateral primary osteoarthritis, left hip: Secondary | ICD-10-CM | POA: Diagnosis not present

## 2020-08-26 DIAGNOSIS — M1611 Unilateral primary osteoarthritis, right hip: Secondary | ICD-10-CM | POA: Diagnosis not present

## 2020-08-26 DIAGNOSIS — I10 Essential (primary) hypertension: Secondary | ICD-10-CM | POA: Diagnosis not present

## 2020-08-26 DIAGNOSIS — R82998 Other abnormal findings in urine: Secondary | ICD-10-CM | POA: Diagnosis not present

## 2020-08-26 DIAGNOSIS — M353 Polymyalgia rheumatica: Secondary | ICD-10-CM | POA: Diagnosis not present

## 2020-08-26 DIAGNOSIS — M810 Age-related osteoporosis without current pathological fracture: Secondary | ICD-10-CM | POA: Diagnosis not present

## 2020-08-26 DIAGNOSIS — Z0000 Encounter for general adult medical examination without abnormal findings: Secondary | ICD-10-CM | POA: Diagnosis not present

## 2020-08-26 DIAGNOSIS — F5101 Primary insomnia: Secondary | ICD-10-CM | POA: Diagnosis not present

## 2020-08-26 DIAGNOSIS — E785 Hyperlipidemia, unspecified: Secondary | ICD-10-CM | POA: Diagnosis not present

## 2020-08-26 DIAGNOSIS — D62 Acute posthemorrhagic anemia: Secondary | ICD-10-CM | POA: Diagnosis not present

## 2020-09-06 DIAGNOSIS — M2550 Pain in unspecified joint: Secondary | ICD-10-CM | POA: Diagnosis not present

## 2020-09-06 DIAGNOSIS — Z6824 Body mass index (BMI) 24.0-24.9, adult: Secondary | ICD-10-CM | POA: Diagnosis not present

## 2020-09-06 DIAGNOSIS — M353 Polymyalgia rheumatica: Secondary | ICD-10-CM | POA: Diagnosis not present

## 2020-11-10 DIAGNOSIS — Z6824 Body mass index (BMI) 24.0-24.9, adult: Secondary | ICD-10-CM | POA: Diagnosis not present

## 2020-11-10 DIAGNOSIS — M353 Polymyalgia rheumatica: Secondary | ICD-10-CM | POA: Diagnosis not present

## 2020-11-10 DIAGNOSIS — Z7952 Long term (current) use of systemic steroids: Secondary | ICD-10-CM | POA: Diagnosis not present

## 2020-11-10 DIAGNOSIS — M2550 Pain in unspecified joint: Secondary | ICD-10-CM | POA: Diagnosis not present

## 2020-11-10 DIAGNOSIS — M810 Age-related osteoporosis without current pathological fracture: Secondary | ICD-10-CM | POA: Diagnosis not present

## 2021-01-09 DIAGNOSIS — M353 Polymyalgia rheumatica: Secondary | ICD-10-CM | POA: Diagnosis not present

## 2021-01-09 DIAGNOSIS — I10 Essential (primary) hypertension: Secondary | ICD-10-CM | POA: Diagnosis not present

## 2021-01-23 DIAGNOSIS — M353 Polymyalgia rheumatica: Secondary | ICD-10-CM | POA: Diagnosis not present

## 2021-01-23 DIAGNOSIS — M810 Age-related osteoporosis without current pathological fracture: Secondary | ICD-10-CM | POA: Diagnosis not present

## 2021-01-23 DIAGNOSIS — Z6824 Body mass index (BMI) 24.0-24.9, adult: Secondary | ICD-10-CM | POA: Diagnosis not present

## 2021-01-23 DIAGNOSIS — Z7952 Long term (current) use of systemic steroids: Secondary | ICD-10-CM | POA: Diagnosis not present

## 2021-01-23 DIAGNOSIS — M2550 Pain in unspecified joint: Secondary | ICD-10-CM | POA: Diagnosis not present

## 2021-04-25 DIAGNOSIS — E663 Overweight: Secondary | ICD-10-CM | POA: Diagnosis not present

## 2021-04-25 DIAGNOSIS — Z6825 Body mass index (BMI) 25.0-25.9, adult: Secondary | ICD-10-CM | POA: Diagnosis not present

## 2021-04-25 DIAGNOSIS — Z7952 Long term (current) use of systemic steroids: Secondary | ICD-10-CM | POA: Diagnosis not present

## 2021-04-25 DIAGNOSIS — M2550 Pain in unspecified joint: Secondary | ICD-10-CM | POA: Diagnosis not present

## 2021-04-25 DIAGNOSIS — M353 Polymyalgia rheumatica: Secondary | ICD-10-CM | POA: Diagnosis not present

## 2021-04-25 DIAGNOSIS — M810 Age-related osteoporosis without current pathological fracture: Secondary | ICD-10-CM | POA: Diagnosis not present

## 2021-05-01 DIAGNOSIS — H524 Presbyopia: Secondary | ICD-10-CM | POA: Diagnosis not present

## 2021-05-01 DIAGNOSIS — H2511 Age-related nuclear cataract, right eye: Secondary | ICD-10-CM | POA: Diagnosis not present

## 2021-05-01 DIAGNOSIS — H26492 Other secondary cataract, left eye: Secondary | ICD-10-CM | POA: Diagnosis not present

## 2021-05-01 DIAGNOSIS — Z961 Presence of intraocular lens: Secondary | ICD-10-CM | POA: Diagnosis not present

## 2021-06-03 DIAGNOSIS — Z23 Encounter for immunization: Secondary | ICD-10-CM | POA: Diagnosis not present

## 2021-06-08 DIAGNOSIS — H26492 Other secondary cataract, left eye: Secondary | ICD-10-CM | POA: Diagnosis not present

## 2021-06-09 ENCOUNTER — Other Ambulatory Visit: Payer: Self-pay

## 2021-06-09 ENCOUNTER — Other Ambulatory Visit (HOSPITAL_COMMUNITY): Payer: Self-pay | Admitting: Internal Medicine

## 2021-06-09 ENCOUNTER — Ambulatory Visit (HOSPITAL_COMMUNITY)
Admission: RE | Admit: 2021-06-09 | Discharge: 2021-06-09 | Disposition: A | Discharge status: Home / Self Care | Payer: Medicare HMO | Source: Ambulatory Visit | Attending: Internal Medicine | Admitting: Internal Medicine

## 2021-06-09 DIAGNOSIS — M353 Polymyalgia rheumatica: Secondary | ICD-10-CM | POA: Diagnosis not present

## 2021-06-09 DIAGNOSIS — E785 Hyperlipidemia, unspecified: Secondary | ICD-10-CM | POA: Diagnosis not present

## 2021-06-09 DIAGNOSIS — M7989 Other specified soft tissue disorders: Secondary | ICD-10-CM | POA: Diagnosis not present

## 2021-06-09 DIAGNOSIS — I10 Essential (primary) hypertension: Secondary | ICD-10-CM | POA: Diagnosis not present

## 2021-06-09 DIAGNOSIS — M79662 Pain in left lower leg: Secondary | ICD-10-CM | POA: Diagnosis not present

## 2021-06-09 DIAGNOSIS — R600 Localized edema: Secondary | ICD-10-CM | POA: Diagnosis not present

## 2021-06-15 DIAGNOSIS — M25562 Pain in left knee: Secondary | ICD-10-CM | POA: Diagnosis not present

## 2021-06-20 DIAGNOSIS — H2511 Age-related nuclear cataract, right eye: Secondary | ICD-10-CM | POA: Diagnosis not present

## 2021-06-20 DIAGNOSIS — H25811 Combined forms of age-related cataract, right eye: Secondary | ICD-10-CM | POA: Diagnosis not present

## 2021-07-19 DIAGNOSIS — Z961 Presence of intraocular lens: Secondary | ICD-10-CM | POA: Diagnosis not present

## 2021-07-20 DIAGNOSIS — M25551 Pain in right hip: Secondary | ICD-10-CM | POA: Diagnosis not present

## 2021-07-20 DIAGNOSIS — M25561 Pain in right knee: Secondary | ICD-10-CM | POA: Diagnosis not present

## 2021-07-20 DIAGNOSIS — M48061 Spinal stenosis, lumbar region without neurogenic claudication: Secondary | ICD-10-CM | POA: Diagnosis not present

## 2021-07-22 DIAGNOSIS — M48061 Spinal stenosis, lumbar region without neurogenic claudication: Secondary | ICD-10-CM | POA: Insufficient documentation

## 2021-07-27 DIAGNOSIS — Z7952 Long term (current) use of systemic steroids: Secondary | ICD-10-CM | POA: Diagnosis not present

## 2021-07-27 DIAGNOSIS — M2550 Pain in unspecified joint: Secondary | ICD-10-CM | POA: Diagnosis not present

## 2021-07-27 DIAGNOSIS — M353 Polymyalgia rheumatica: Secondary | ICD-10-CM | POA: Diagnosis not present

## 2021-07-27 DIAGNOSIS — M159 Polyosteoarthritis, unspecified: Secondary | ICD-10-CM | POA: Diagnosis not present

## 2021-07-27 DIAGNOSIS — M545 Low back pain, unspecified: Secondary | ICD-10-CM | POA: Diagnosis not present

## 2021-07-27 DIAGNOSIS — Z6824 Body mass index (BMI) 24.0-24.9, adult: Secondary | ICD-10-CM | POA: Diagnosis not present

## 2021-07-27 DIAGNOSIS — M7122 Synovial cyst of popliteal space [Baker], left knee: Secondary | ICD-10-CM | POA: Diagnosis not present

## 2021-08-10 DIAGNOSIS — M5451 Vertebrogenic low back pain: Secondary | ICD-10-CM | POA: Diagnosis not present

## 2021-09-11 DIAGNOSIS — M25512 Pain in left shoulder: Secondary | ICD-10-CM | POA: Diagnosis not present

## 2021-10-05 DIAGNOSIS — I10 Essential (primary) hypertension: Secondary | ICD-10-CM | POA: Diagnosis not present

## 2021-10-05 DIAGNOSIS — E785 Hyperlipidemia, unspecified: Secondary | ICD-10-CM | POA: Diagnosis not present

## 2021-10-05 DIAGNOSIS — M810 Age-related osteoporosis without current pathological fracture: Secondary | ICD-10-CM | POA: Diagnosis not present

## 2021-10-06 DIAGNOSIS — M353 Polymyalgia rheumatica: Secondary | ICD-10-CM | POA: Diagnosis not present

## 2021-10-06 DIAGNOSIS — Z0000 Encounter for general adult medical examination without abnormal findings: Secondary | ICD-10-CM | POA: Diagnosis not present

## 2021-10-06 DIAGNOSIS — F5101 Primary insomnia: Secondary | ICD-10-CM | POA: Diagnosis not present

## 2021-10-06 DIAGNOSIS — M810 Age-related osteoporosis without current pathological fracture: Secondary | ICD-10-CM | POA: Diagnosis not present

## 2021-10-06 DIAGNOSIS — E785 Hyperlipidemia, unspecified: Secondary | ICD-10-CM | POA: Diagnosis not present

## 2021-10-06 DIAGNOSIS — M159 Polyosteoarthritis, unspecified: Secondary | ICD-10-CM | POA: Diagnosis not present

## 2021-10-06 DIAGNOSIS — I10 Essential (primary) hypertension: Secondary | ICD-10-CM | POA: Diagnosis not present

## 2021-10-16 DIAGNOSIS — M25512 Pain in left shoulder: Secondary | ICD-10-CM | POA: Diagnosis not present

## 2021-10-26 DIAGNOSIS — M25512 Pain in left shoulder: Secondary | ICD-10-CM | POA: Diagnosis not present

## 2021-10-27 DIAGNOSIS — M25512 Pain in left shoulder: Secondary | ICD-10-CM | POA: Diagnosis not present

## 2021-10-27 DIAGNOSIS — M545 Low back pain, unspecified: Secondary | ICD-10-CM | POA: Diagnosis not present

## 2021-10-27 DIAGNOSIS — M1991 Primary osteoarthritis, unspecified site: Secondary | ICD-10-CM | POA: Diagnosis not present

## 2021-10-27 DIAGNOSIS — M353 Polymyalgia rheumatica: Secondary | ICD-10-CM | POA: Diagnosis not present

## 2021-10-27 DIAGNOSIS — Z6824 Body mass index (BMI) 24.0-24.9, adult: Secondary | ICD-10-CM | POA: Diagnosis not present

## 2021-10-27 DIAGNOSIS — Z7952 Long term (current) use of systemic steroids: Secondary | ICD-10-CM | POA: Diagnosis not present

## 2021-11-01 DIAGNOSIS — M25512 Pain in left shoulder: Secondary | ICD-10-CM | POA: Diagnosis not present

## 2021-11-01 DIAGNOSIS — M069 Rheumatoid arthritis, unspecified: Secondary | ICD-10-CM | POA: Insufficient documentation

## 2021-11-07 DIAGNOSIS — R5383 Other fatigue: Secondary | ICD-10-CM | POA: Diagnosis not present

## 2021-11-07 DIAGNOSIS — M25512 Pain in left shoulder: Secondary | ICD-10-CM | POA: Diagnosis not present

## 2021-11-07 DIAGNOSIS — M353 Polymyalgia rheumatica: Secondary | ICD-10-CM | POA: Diagnosis not present

## 2021-11-07 DIAGNOSIS — Z6823 Body mass index (BMI) 23.0-23.9, adult: Secondary | ICD-10-CM | POA: Diagnosis not present

## 2021-11-07 DIAGNOSIS — M1991 Primary osteoarthritis, unspecified site: Secondary | ICD-10-CM | POA: Diagnosis not present

## 2021-11-07 DIAGNOSIS — M0609 Rheumatoid arthritis without rheumatoid factor, multiple sites: Secondary | ICD-10-CM | POA: Diagnosis not present

## 2021-12-13 DIAGNOSIS — M0609 Rheumatoid arthritis without rheumatoid factor, multiple sites: Secondary | ICD-10-CM | POA: Diagnosis not present

## 2021-12-13 DIAGNOSIS — Z79899 Other long term (current) drug therapy: Secondary | ICD-10-CM | POA: Diagnosis not present

## 2022-01-17 DIAGNOSIS — Z79899 Other long term (current) drug therapy: Secondary | ICD-10-CM | POA: Diagnosis not present

## 2022-01-17 DIAGNOSIS — M0609 Rheumatoid arthritis without rheumatoid factor, multiple sites: Secondary | ICD-10-CM | POA: Diagnosis not present

## 2022-01-17 DIAGNOSIS — M25512 Pain in left shoulder: Secondary | ICD-10-CM | POA: Diagnosis not present

## 2022-01-17 DIAGNOSIS — Z6824 Body mass index (BMI) 24.0-24.9, adult: Secondary | ICD-10-CM | POA: Diagnosis not present

## 2022-01-17 DIAGNOSIS — M353 Polymyalgia rheumatica: Secondary | ICD-10-CM | POA: Diagnosis not present

## 2022-01-17 DIAGNOSIS — M1991 Primary osteoarthritis, unspecified site: Secondary | ICD-10-CM | POA: Diagnosis not present

## 2022-02-14 DIAGNOSIS — Z79899 Other long term (current) drug therapy: Secondary | ICD-10-CM | POA: Diagnosis not present

## 2022-02-14 DIAGNOSIS — M0609 Rheumatoid arthritis without rheumatoid factor, multiple sites: Secondary | ICD-10-CM | POA: Diagnosis not present

## 2022-03-26 DIAGNOSIS — M353 Polymyalgia rheumatica: Secondary | ICD-10-CM | POA: Diagnosis not present

## 2022-03-26 DIAGNOSIS — Z6824 Body mass index (BMI) 24.0-24.9, adult: Secondary | ICD-10-CM | POA: Diagnosis not present

## 2022-03-26 DIAGNOSIS — Z79899 Other long term (current) drug therapy: Secondary | ICD-10-CM | POA: Diagnosis not present

## 2022-03-26 DIAGNOSIS — M1991 Primary osteoarthritis, unspecified site: Secondary | ICD-10-CM | POA: Diagnosis not present

## 2022-03-26 DIAGNOSIS — M0609 Rheumatoid arthritis without rheumatoid factor, multiple sites: Secondary | ICD-10-CM | POA: Diagnosis not present

## 2022-06-02 DIAGNOSIS — Z23 Encounter for immunization: Secondary | ICD-10-CM | POA: Diagnosis not present

## 2022-06-04 DIAGNOSIS — M25512 Pain in left shoulder: Secondary | ICD-10-CM | POA: Diagnosis not present

## 2022-06-04 DIAGNOSIS — Z79899 Other long term (current) drug therapy: Secondary | ICD-10-CM | POA: Diagnosis not present

## 2022-06-04 DIAGNOSIS — M1991 Primary osteoarthritis, unspecified site: Secondary | ICD-10-CM | POA: Diagnosis not present

## 2022-06-04 DIAGNOSIS — M25562 Pain in left knee: Secondary | ICD-10-CM | POA: Diagnosis not present

## 2022-06-04 DIAGNOSIS — M0609 Rheumatoid arthritis without rheumatoid factor, multiple sites: Secondary | ICD-10-CM | POA: Diagnosis not present

## 2022-06-04 DIAGNOSIS — M25561 Pain in right knee: Secondary | ICD-10-CM | POA: Diagnosis not present

## 2022-06-04 DIAGNOSIS — Z6823 Body mass index (BMI) 23.0-23.9, adult: Secondary | ICD-10-CM | POA: Diagnosis not present

## 2022-07-05 DIAGNOSIS — R7989 Other specified abnormal findings of blood chemistry: Secondary | ICD-10-CM | POA: Diagnosis not present

## 2022-09-04 DIAGNOSIS — Z6823 Body mass index (BMI) 23.0-23.9, adult: Secondary | ICD-10-CM | POA: Diagnosis not present

## 2022-09-04 DIAGNOSIS — M353 Polymyalgia rheumatica: Secondary | ICD-10-CM | POA: Diagnosis not present

## 2022-09-04 DIAGNOSIS — M0609 Rheumatoid arthritis without rheumatoid factor, multiple sites: Secondary | ICD-10-CM | POA: Diagnosis not present

## 2022-09-04 DIAGNOSIS — Z79899 Other long term (current) drug therapy: Secondary | ICD-10-CM | POA: Diagnosis not present

## 2022-09-04 DIAGNOSIS — M1991 Primary osteoarthritis, unspecified site: Secondary | ICD-10-CM | POA: Diagnosis not present

## 2022-10-07 DIAGNOSIS — Z20822 Contact with and (suspected) exposure to covid-19: Secondary | ICD-10-CM | POA: Diagnosis not present

## 2022-10-07 DIAGNOSIS — U071 COVID-19: Secondary | ICD-10-CM | POA: Diagnosis not present

## 2022-11-01 DIAGNOSIS — L309 Dermatitis, unspecified: Secondary | ICD-10-CM | POA: Diagnosis not present

## 2022-11-30 DIAGNOSIS — H04123 Dry eye syndrome of bilateral lacrimal glands: Secondary | ICD-10-CM | POA: Diagnosis not present

## 2022-11-30 DIAGNOSIS — Z961 Presence of intraocular lens: Secondary | ICD-10-CM | POA: Diagnosis not present

## 2022-11-30 DIAGNOSIS — H52203 Unspecified astigmatism, bilateral: Secondary | ICD-10-CM | POA: Diagnosis not present

## 2022-11-30 DIAGNOSIS — H524 Presbyopia: Secondary | ICD-10-CM | POA: Diagnosis not present

## 2022-12-03 DIAGNOSIS — M810 Age-related osteoporosis without current pathological fracture: Secondary | ICD-10-CM | POA: Diagnosis not present

## 2022-12-03 DIAGNOSIS — I10 Essential (primary) hypertension: Secondary | ICD-10-CM | POA: Diagnosis not present

## 2022-12-03 DIAGNOSIS — E785 Hyperlipidemia, unspecified: Secondary | ICD-10-CM | POA: Diagnosis not present

## 2022-12-04 DIAGNOSIS — M0609 Rheumatoid arthritis without rheumatoid factor, multiple sites: Secondary | ICD-10-CM | POA: Diagnosis not present

## 2022-12-10 DIAGNOSIS — Z1331 Encounter for screening for depression: Secondary | ICD-10-CM | POA: Diagnosis not present

## 2022-12-10 DIAGNOSIS — R82998 Other abnormal findings in urine: Secondary | ICD-10-CM | POA: Diagnosis not present

## 2022-12-10 DIAGNOSIS — M353 Polymyalgia rheumatica: Secondary | ICD-10-CM | POA: Diagnosis not present

## 2022-12-10 DIAGNOSIS — M159 Polyosteoarthritis, unspecified: Secondary | ICD-10-CM | POA: Diagnosis not present

## 2022-12-10 DIAGNOSIS — M810 Age-related osteoporosis without current pathological fracture: Secondary | ICD-10-CM | POA: Diagnosis not present

## 2022-12-10 DIAGNOSIS — Z1339 Encounter for screening examination for other mental health and behavioral disorders: Secondary | ICD-10-CM | POA: Diagnosis not present

## 2022-12-10 DIAGNOSIS — Z0000 Encounter for general adult medical examination without abnormal findings: Secondary | ICD-10-CM | POA: Diagnosis not present

## 2022-12-10 DIAGNOSIS — I10 Essential (primary) hypertension: Secondary | ICD-10-CM | POA: Diagnosis not present

## 2022-12-10 DIAGNOSIS — E785 Hyperlipidemia, unspecified: Secondary | ICD-10-CM | POA: Diagnosis not present

## 2023-01-29 ENCOUNTER — Emergency Department (HOSPITAL_COMMUNITY): Payer: Medicare HMO

## 2023-01-29 ENCOUNTER — Other Ambulatory Visit: Payer: Self-pay

## 2023-01-29 ENCOUNTER — Emergency Department (HOSPITAL_BASED_OUTPATIENT_CLINIC_OR_DEPARTMENT_OTHER)
Admission: EM | Admit: 2023-01-29 | Discharge: 2023-01-29 | Disposition: A | Discharge status: Home / Self Care | Payer: Medicare HMO | Source: Home / Self Care | Attending: Emergency Medicine | Admitting: Emergency Medicine

## 2023-01-29 ENCOUNTER — Emergency Department (HOSPITAL_BASED_OUTPATIENT_CLINIC_OR_DEPARTMENT_OTHER): Payer: Medicare HMO

## 2023-01-29 ENCOUNTER — Encounter (HOSPITAL_COMMUNITY): Payer: Self-pay | Admitting: Emergency Medicine

## 2023-01-29 ENCOUNTER — Observation Stay (HOSPITAL_COMMUNITY)
Admission: EM | Admit: 2023-01-29 | Discharge: 2023-01-31 | Disposition: A | Discharge status: Home / Self Care | Payer: Medicare HMO | Attending: Internal Medicine | Admitting: Internal Medicine

## 2023-01-29 ENCOUNTER — Emergency Department (HOSPITAL_BASED_OUTPATIENT_CLINIC_OR_DEPARTMENT_OTHER): Payer: Medicare HMO | Admitting: Radiology

## 2023-01-29 DIAGNOSIS — S42141A Displaced fracture of glenoid cavity of scapula, right shoulder, initial encounter for closed fracture: Secondary | ICD-10-CM | POA: Insufficient documentation

## 2023-01-29 DIAGNOSIS — W19XXXA Unspecified fall, initial encounter: Secondary | ICD-10-CM

## 2023-01-29 DIAGNOSIS — M160 Bilateral primary osteoarthritis of hip: Secondary | ICD-10-CM | POA: Diagnosis not present

## 2023-01-29 DIAGNOSIS — R296 Repeated falls: Secondary | ICD-10-CM | POA: Diagnosis not present

## 2023-01-29 DIAGNOSIS — W01198A Fall on same level from slipping, tripping and stumbling with subsequent striking against other object, initial encounter: Secondary | ICD-10-CM | POA: Insufficient documentation

## 2023-01-29 DIAGNOSIS — S50312A Abrasion of left elbow, initial encounter: Secondary | ICD-10-CM | POA: Insufficient documentation

## 2023-01-29 DIAGNOSIS — W01190A Fall on same level from slipping, tripping and stumbling with subsequent striking against furniture, initial encounter: Secondary | ICD-10-CM | POA: Insufficient documentation

## 2023-01-29 DIAGNOSIS — Z043 Encounter for examination and observation following other accident: Secondary | ICD-10-CM | POA: Diagnosis not present

## 2023-01-29 DIAGNOSIS — I10 Essential (primary) hypertension: Secondary | ICD-10-CM | POA: Diagnosis not present

## 2023-01-29 DIAGNOSIS — R55 Syncope and collapse: Secondary | ICD-10-CM | POA: Diagnosis not present

## 2023-01-29 DIAGNOSIS — E876 Hypokalemia: Secondary | ICD-10-CM | POA: Diagnosis not present

## 2023-01-29 DIAGNOSIS — R61 Generalized hyperhidrosis: Secondary | ICD-10-CM | POA: Diagnosis not present

## 2023-01-29 DIAGNOSIS — M069 Rheumatoid arthritis, unspecified: Secondary | ICD-10-CM | POA: Diagnosis not present

## 2023-01-29 DIAGNOSIS — Z79899 Other long term (current) drug therapy: Secondary | ICD-10-CM | POA: Diagnosis not present

## 2023-01-29 DIAGNOSIS — E785 Hyperlipidemia, unspecified: Secondary | ICD-10-CM | POA: Diagnosis present

## 2023-01-29 DIAGNOSIS — S82034A Nondisplaced transverse fracture of right patella, initial encounter for closed fracture: Secondary | ICD-10-CM | POA: Diagnosis not present

## 2023-01-29 DIAGNOSIS — R001 Bradycardia, unspecified: Secondary | ICD-10-CM | POA: Diagnosis not present

## 2023-01-29 DIAGNOSIS — R262 Difficulty in walking, not elsewhere classified: Secondary | ICD-10-CM | POA: Insufficient documentation

## 2023-01-29 DIAGNOSIS — S82001A Unspecified fracture of right patella, initial encounter for closed fracture: Secondary | ICD-10-CM | POA: Insufficient documentation

## 2023-01-29 DIAGNOSIS — Y92009 Unspecified place in unspecified non-institutional (private) residence as the place of occurrence of the external cause: Secondary | ICD-10-CM | POA: Insufficient documentation

## 2023-01-29 DIAGNOSIS — R1111 Vomiting without nausea: Secondary | ICD-10-CM | POA: Diagnosis not present

## 2023-01-29 LAB — CBC WITH DIFFERENTIAL/PLATELET
Abs Immature Granulocytes: 0.04 10*3/uL (ref 0.00–0.07)
Basophils Absolute: 0 10*3/uL (ref 0.0–0.1)
Basophils Relative: 0 %
Eosinophils Absolute: 0 10*3/uL (ref 0.0–0.5)
Eosinophils Relative: 0 %
HCT: 40.2 % (ref 36.0–46.0)
Hemoglobin: 14.2 g/dL (ref 12.0–15.0)
Immature Granulocytes: 0 %
Lymphocytes Relative: 12 %
Lymphs Abs: 1.1 10*3/uL (ref 0.7–4.0)
MCH: 33.2 pg (ref 26.0–34.0)
MCHC: 35.3 g/dL (ref 30.0–36.0)
MCV: 93.9 fL (ref 80.0–100.0)
Monocytes Absolute: 0.8 10*3/uL (ref 0.1–1.0)
Monocytes Relative: 8 %
Neutro Abs: 7.3 10*3/uL (ref 1.7–7.7)
Neutrophils Relative %: 80 %
Platelets: 206 10*3/uL (ref 150–400)
RBC: 4.28 MIL/uL (ref 3.87–5.11)
RDW: 13.1 % (ref 11.5–15.5)
WBC: 9.3 10*3/uL (ref 4.0–10.5)
nRBC: 0 % (ref 0.0–0.2)

## 2023-01-29 LAB — URINALYSIS, ROUTINE W REFLEX MICROSCOPIC
Bilirubin Urine: NEGATIVE
Glucose, UA: NEGATIVE mg/dL
Hgb urine dipstick: NEGATIVE
Ketones, ur: NEGATIVE mg/dL
Nitrite: NEGATIVE
Protein, ur: NEGATIVE mg/dL
Specific Gravity, Urine: 1.014 (ref 1.005–1.030)
pH: 7 (ref 5.0–8.0)

## 2023-01-29 LAB — BASIC METABOLIC PANEL
Anion gap: 10 (ref 5–15)
BUN: 11 mg/dL (ref 8–23)
CO2: 25 mmol/L (ref 22–32)
Calcium: 8.9 mg/dL (ref 8.9–10.3)
Chloride: 104 mmol/L (ref 98–111)
Creatinine, Ser: 0.83 mg/dL (ref 0.44–1.00)
GFR, Estimated: >60 mL/min (ref >60)
Glucose, Bld: 112 mg/dL — ABNORMAL HIGH (ref 70–99)
Potassium: 3.4 mmol/L — ABNORMAL LOW (ref 3.5–5.1)
Sodium: 139 mmol/L (ref 135–145)

## 2023-01-29 LAB — MAGNESIUM: Magnesium: 2.2 mg/dL (ref 1.7–2.4)

## 2023-01-29 LAB — CBG MONITORING, ED: Glucose-Capillary: 101 mg/dL — ABNORMAL HIGH (ref 70–99)

## 2023-01-29 LAB — TROPONIN I (HIGH SENSITIVITY)
Troponin I (High Sensitivity): 3 ng/L (ref <18)
Troponin I (High Sensitivity): 3 ng/L (ref <18)

## 2023-01-29 LAB — TSH: TSH: 3.077 u[IU]/mL (ref 0.350–4.500)

## 2023-01-29 MED ORDER — ZOLPIDEM TARTRATE 5 MG PO TABS
5.0000 mg | ORAL_TABLET | Freq: Once | ORAL | Status: AC
  Administered 2023-01-29: 5 mg via ORAL
  Filled 2023-01-29: qty 1

## 2023-01-29 MED ORDER — HYDROCODONE-ACETAMINOPHEN 5-325 MG PO TABS
1.0000 | ORAL_TABLET | Freq: Once | ORAL | Status: AC
  Administered 2023-01-29: 1 via ORAL
  Filled 2023-01-29: qty 1

## 2023-01-29 MED ORDER — ACETAMINOPHEN 650 MG RE SUPP: 650.0000 mg | Freq: Four times a day (QID) | RECTAL | Status: DC | PRN

## 2023-01-29 MED ORDER — POLYETHYLENE GLYCOL 3350 17 G PO PACK
17.0000 g | PACK | Freq: Every day | ORAL | Status: DC | PRN
  Administered 2023-01-31: 17 g via ORAL
  Filled 2023-01-29: qty 1

## 2023-01-29 MED ORDER — SODIUM CHLORIDE 0.9% FLUSH
3.0000 mL | Freq: Two times a day (BID) | INTRAVENOUS | Status: DC
  Administered 2023-01-29 – 2023-01-31 (×5): 3 mL via INTRAVENOUS

## 2023-01-29 MED ORDER — ONDANSETRON HCL 4 MG/2ML IJ SOLN
4.0000 mg | Freq: Four times a day (QID) | INTRAMUSCULAR | Status: AC | PRN
  Administered 2023-01-30: 4 mg via INTRAVENOUS
  Filled 2023-01-29: qty 2

## 2023-01-29 MED ORDER — HYDROCODONE-ACETAMINOPHEN 5-325 MG PO TABS: 1.0000 | ORAL_TABLET | Freq: Four times a day (QID) | ORAL | 0 refills | Status: DC | PRN

## 2023-01-29 MED ORDER — OXYCODONE-ACETAMINOPHEN 5-325 MG PO TABS
1.0000 | ORAL_TABLET | ORAL | Status: DC | PRN
  Administered 2023-01-29 – 2023-01-30 (×4): 1 via ORAL
  Filled 2023-01-29 (×4): qty 1

## 2023-01-29 MED ORDER — PANTOPRAZOLE SODIUM 40 MG PO TBEC
40.0000 mg | DELAYED_RELEASE_TABLET | Freq: Every day | ORAL | Status: DC
  Administered 2023-01-30 – 2023-01-31 (×2): 40 mg via ORAL
  Filled 2023-01-29 (×2): qty 1

## 2023-01-29 MED ORDER — AMLODIPINE BESYLATE 5 MG PO TABS
2.5000 mg | ORAL_TABLET | Freq: Every day | ORAL | Status: DC
  Administered 2023-01-30: 2.5 mg via ORAL
  Filled 2023-01-29: qty 1

## 2023-01-29 MED ORDER — PRAVASTATIN SODIUM 40 MG PO TABS
80.0000 mg | ORAL_TABLET | Freq: Every day | ORAL | Status: DC
  Administered 2023-01-30 – 2023-01-31 (×2): 80 mg via ORAL
  Filled 2023-01-29 (×2): qty 2

## 2023-01-29 MED ORDER — POTASSIUM CHLORIDE CRYS ER 20 MEQ PO TBCR
40.0000 meq | EXTENDED_RELEASE_TABLET | Freq: Once | ORAL | Status: AC
  Administered 2023-01-29: 40 meq via ORAL
  Filled 2023-01-29: qty 2

## 2023-01-29 MED ORDER — LACTATED RINGERS IV BOLUS
500.0000 mL | Freq: Once | INTRAVENOUS | Status: AC
  Administered 2023-01-29: 500 mL via INTRAVENOUS

## 2023-01-29 MED ORDER — ONDANSETRON HCL 4 MG PO TABS: 4.0000 mg | ORAL_TABLET | Freq: Four times a day (QID) | ORAL | Status: AC | PRN

## 2023-01-29 MED ORDER — ACETAMINOPHEN 325 MG PO TABS
650.0000 mg | ORAL_TABLET | Freq: Four times a day (QID) | ORAL | Status: DC | PRN
  Administered 2023-01-30 – 2023-01-31 (×3): 650 mg via ORAL
  Filled 2023-01-29 (×3): qty 2

## 2023-01-29 MED ORDER — ENOXAPARIN SODIUM 40 MG/0.4ML IJ SOSY
40.0000 mg | PREFILLED_SYRINGE | INTRAMUSCULAR | Status: DC
  Administered 2023-01-29 – 2023-01-30 (×2): 40 mg via SUBCUTANEOUS
  Filled 2023-01-29 (×2): qty 0.4

## 2023-01-29 NOTE — ED Provider Notes (Signed)
[place] EMERGENCY DEPARTMENT AT [facility] Provider Note   CSN: [id number] Arrival date & time: 01/29/23  1023     History  Chief Complaint  Patient presents with   Loss of Consciousness    Chelsea Walton is a 82 y.o. female.   Loss of Consciousness Associated symptoms: nausea and vomiting   Patient presents for syncope.  Medical history includes HTN, HLD, GI bleeding.  She is not on a blood thinner.  Late last night, she was sitting on a couch and saw a bug crawling across the floor.  She got up off of the couch to kill the bug.  If she did so, she did have a brief episode of lightheadedness and did have a fall forward.  She fell onto a brick floor, striking her elbows and knees.  She did not lose consciousness at the time.  She was seen at [facility].  She underwent imaging studies which showed nondisplaced right patellar fracture and fracture of inferior aspect of right glenoid.  She was placed in a right shoulder sling and a right knee immobilizer.  She was given a dose of Norco prior to discharge.  This was prescribed but she has not yet picked up her prescription.  She did not sleep after she was discharged.  She did go to the bathroom with help from her husband.  As she was standing in the bathroom, she experienced a prodrome of lightheadedness and nausea.  Husband was close by to assist her to the ground.  She did lose consciousness briefly.  EMS was called to her home.  She was given 200 cc IVF prior to arrival.  Currently, she endorses resolved nausea.  She endorses fatigue which she attributes to not sleeping in the past 24 hours.  She does state that she has had prior episodes of syncope in the past.       Home Medications Prior to Admission medications   Medication Sig Start Date End Date Taking? Authorizing Provider  acetaminophen (TYLENOL) 500 MG tablet Take 1,000 mg by mouth every 6 (six) hours as needed (for pain).    [provider]  amLODipine (NORVASC) 2.5 MG tablet Take 1 tablet (2.5 mg total) by mouth daily. 02/02/20   Zannie Cove, MD  doxylamine, Sleep, (UNISOM) 25 MG tablet Take 25 mg by mouth at bedtime.    [provider]  ferrous sulfate 325 (65 FE) MG EC tablet Take 1 tablet (325 mg total) by mouth 2 (two) times daily with a meal. 02/02/20   Zannie Cove, MD  gabapentin (NEURONTIN) 300 MG capsule Take 300 mg by mouth daily as needed (for hip pain).    [provider]  HYDROcodone-acetaminophen (NORCO/VICODIN) 5-325 MG tablet Take 1 tablet by mouth every 6 (six) hours as needed for severe pain. 01/29/23   Pollyann Savoy, MD  ibandronate (BONIVA) 150 MG tablet Take 150 mg by mouth every 30 (thirty) days. Take in the morning with a full glass of water, on an empty stomach, and do not take anything else by mouth or lie down for the next 30 min.    [provider]  pantoprazole (PROTONIX) 40 MG tablet Take 1 tablet (40 mg total) by mouth daily before breakfast. 05/26/20   Arnaldo Natal, NP  pravastatin (PRAVACHOL) 80 MG tablet Take 80 mg by mouth daily.  09/30/17   [place], Peter M, MD  vitamin C (ASCORBIC ACID) 250 MG tablet Take 1 tablet (250  mg total) by mouth daily. 02/02/20   Zannie Cove, MD      Allergies    Meloxicam, Tribenzor [olmesartan-amlodipine-hctz], Ibuprofen, and Metoprolol    Review of Systems   Review of Systems  Cardiovascular:  Positive for syncope.  Gastrointestinal:  Positive for nausea and vomiting.  Musculoskeletal:  Positive for arthralgias.  Neurological:  Positive for syncope and light-headedness.  All other systems reviewed and are negative.   Physical Exam Updated Vital Signs BP 133/88 (BP Location: Left Arm)   Pulse 96   Temp 98.5 F (36.9 C) (Oral)   Resp 19   Ht 4\' 11"  (1.499 m)   Wt 51.3 kg   SpO2 97%   BMI 22.82 kg/m  Physical Exam Vitals and nursing note reviewed.  Constitutional:      General: She is not in acute  distress.    Appearance: Normal appearance. She is well-developed. She is not ill-appearing, toxic-appearing or diaphoretic.  HENT:     Head: Normocephalic and atraumatic.     Right Ear: External ear normal.     Left Ear: External ear normal.     Nose: Nose normal.     Mouth/Throat:     Mouth: Mucous membranes are moist.  Eyes:     Extraocular Movements: Extraocular movements intact.     Conjunctiva/sclera: Conjunctivae normal.  Cardiovascular:     Rate and Rhythm: Normal rate and regular rhythm.     Heart sounds: No murmur heard. Pulmonary:     Effort: Pulmonary effort is normal. No respiratory distress.     Breath sounds: Normal breath sounds. No wheezing or rales.  Chest:     Chest wall: No tenderness.  Abdominal:     General: There is no distension.     Palpations: Abdomen is soft.     Tenderness: There is no abdominal tenderness.  Musculoskeletal:     Cervical back: Normal range of motion and neck supple.     Right lower leg: No edema.     Left lower leg: No edema.     Comments: Right shoulder sling and right short knee immobilizer in place  Skin:    General: Skin is warm and dry.     Coloration: Skin is not jaundiced or pale.  Neurological:     General: No focal deficit present.     Mental Status: She is alert and oriented to person, place, and time.     Cranial Nerves: No cranial nerve deficit.     Sensory: No sensory deficit.     Motor: No weakness.     Coordination: Coordination normal.  Psychiatric:        Mood and Affect: Mood normal.        Behavior: Behavior normal.        Thought Content: Thought content normal.        Judgment: Judgment normal.     ED Results / Procedures / Treatments   Labs (all labs ordered are listed, but only abnormal results are displayed) Labs Reviewed  BASIC METABOLIC PANEL - Abnormal; Notable for the following components:      Result Value   Potassium 3.4 (*)    Glucose, Bld 112 (*)    All other components within normal  limits  CBC WITH DIFFERENTIAL/PLATELET  TSH  MAGNESIUM  URINALYSIS, ROUTINE W REFLEX MICROSCOPIC  CBG MONITORING, ED  TROPONIN I (HIGH SENSITIVITY)  TROPONIN I (HIGH SENSITIVITY)    EKG None  Radiology DG Chest [facility] 1 View  Result  Date: 01/29/2023 CLINICAL DATA:  Syncope EXAM: PORTABLE CHEST 1 VIEW COMPARISON:  Chest radiograph 08/22/2017 FINDINGS: The cardiomediastinal silhouette is normal There is no focal consolidation or pulmonary edema. There is no pleural effusion or pneumothorax There is no acute osseous abnormality. IMPRESSION: No radiographic evidence of acute cardiopulmonary process. Electronically Signed   By: Lesia Hausen M.D.   On: 01/29/2023 11:34   CT Head Wo Contrast  Result Date: 01/29/2023 CLINICAL DATA:  Fall EXAM: CT HEAD WITHOUT CONTRAST TECHNIQUE: Contiguous axial images were obtained from the base of the skull through the vertex without intravenous contrast. RADIATION DOSE REDUCTION: This exam was performed according to the departmental dose-optimization program which includes automated exposure control, adjustment of the mA and/or kV according to patient size and/or use of iterative reconstruction technique. COMPARISON:  None Available. FINDINGS: Brain: No intracranial hemorrhage, mass effect, or evidence of acute infarct. No hydrocephalus. No extra-axial fluid collection. Generalized cerebral atrophy. Ill-defined hypoattenuation within the cerebral white matter is nonspecific but consistent with chronic small vessel ischemic disease. Vascular: No hyperdense vessel. Intracranial arterial calcification. Skull: No fracture or focal lesion. Sinuses/Orbits: No acute finding. Paranasal sinuses and mastoid air cells are well aerated. Other: None. IMPRESSION: 1. No acute intracranial abnormality. Electronically Signed   By: Minerva Fester M.D.   On: 01/29/2023 03:29   DG Knee Complete 4 Views Right  Result Date: 01/29/2023 CLINICAL DATA:  Status post fall. EXAM: RIGHT KNEE -  COMPLETE 4+ VIEW COMPARISON:  None Available. FINDINGS: An acute, nondisplaced fracture deformity is seen extending through the inferior aspect of the right patella. There is no evidence of dislocation. A moderate to large joint effusion is seen. Moderate severity soft tissue swelling is seen anterior to the right patella. IMPRESSION: 1. Acute, nondisplaced fracture of the right patella. 2. Moderate to large joint effusion. Electronically Signed   By: Aram Candela M.D.   On: 01/29/2023 01:17   DG Hip Unilat W or Wo Pelvis 2-3 Views Right  Result Date: 01/29/2023 CLINICAL DATA:  Status post fall. EXAM: DG HIP (WITH OR WITHOUT PELVIS) 2-3V RIGHT COMPARISON:  None Available. FINDINGS: There is no evidence of hip fracture or dislocation. Degenerative changes seen involving both hips, in the form of joint space narrowing and acetabular sclerosis. IMPRESSION: Degenerative changes without evidence of acute fracture or dislocation. Electronically Signed   By: Aram Candela M.D.   On: 01/29/2023 01:15   DG Shoulder Right  Result Date: 01/29/2023 CLINICAL DATA:  Status post fall. EXAM: RIGHT SHOULDER - 2+ VIEW COMPARISON:  None Available. FINDINGS: There is acute fracture deformity involving the inferior aspect of the right glenoid. There is no evidence of dislocation. Moderate severity degenerative changes are seen involving the right acromioclavicular joint. Soft tissues are unremarkable. IMPRESSION: Acute fracture of the inferior aspect of the right glenoid. Electronically Signed   By: Aram Candela M.D.   On: 01/29/2023 01:14    Procedures Procedures    Medications Ordered in ED Medications  lactated ringers bolus 500 mL (has no administration in time range)  potassium chloride SA (KLOR-CON M) CR tablet 40 mEq (40 mEq Oral Given 01/29/23 1316)    ED Course/ Medical Decision Making/ A&P                             Medical Decision Making Amount and/or Complexity of Data Reviewed Labs:  ordered. Radiology: ordered. ECG/medicine tests: ordered.  Risk Prescription drug management.  This patient presents to the ED for concern of syncope, this involves an extensive number of treatment options, and is a complaint that carries with it a high risk of complications and morbidity.  The differential diagnosis includes arrhythmia, vasovagal episode, cardiac valvular dysfunction, anemia, metabolic derangements   Co morbidities that complicate the patient evaluation  HTN, HLD, GI bleeding   Additional history obtained:  Additional history obtained from patient's husband External records from outside source obtained and reviewed including EMR   Lab Tests:  I Ordered, and personally interpreted labs.  The pertinent results include: Slight hypokalemia with otherwise normal electrolytes, normal hemoglobin, no leukocytosis   Imaging Studies ordered:  I ordered imaging studies including chest x-ray I independently visualized and interpreted imaging which showed no acute findings I agree with the radiologist interpretation   Cardiac Monitoring: / EKG:  The patient was maintained on a cardiac monitor.  I personally viewed and interpreted the cardiac monitored which showed an underlying rhythm of: Sinus rhythm  Problem List / ED Course / Critical interventions / Medication management  Patient presents after a syncopal episode.  This occurred at home while in the bathroom.  She was able to be eased to the floor by her husband.  LOC was brief.  She did have 1 episode of vomiting.  On arrival in the ED, vital signs are normal.  Patient is well-appearing on exam.  Currently, she endorses fatigue only which she attributes to not sleeping the past 24 hours.  Patient was placed on bedside cardiac monitor.  EKG shows normal sinus rhythm.  Laboratory workup was initiated to assess for anemia or electrolyte disturbances which may have contributed to her syncopal episode as well as her  symptoms of lightheadedness which led to a fall last night.  Lab work was notable for mild hypokalemia.  Replacing potassium was ordered.  On reassessment, patient endorses ongoing fatigue.  She denies any current dizziness or lightheadedness.  On cardiac monitor, she remained in normal sinus rhythm.  Blood pressure remained normal while in the ED.  Currently, pain from her recent injuries is controlled while at rest.  I had a shared decision-making with patient and her husband.  Both feel more comfortable with syncope observation given her multiple episodes of syncope/near syncope that occurred since last night.  I feel that this is reasonable.  Patient was admitted for further management. I ordered medication including IV fluids for hydration; testing provide for hypokalemia Reevaluation of the patient after these medicines showed that the patient improved I have reviewed the patients home medicines and have made adjustments as needed   Social Determinants of Health:  Has PCP         Final Clinical Impression(s) / ED Diagnoses Final diagnoses:  Syncope and collapse    Rx / DC Orders ED Discharge Orders     None         Gloris Manchester, MD 01/29/23 1458

## 2023-01-29 NOTE — H&P (Signed)
History and Physical   Chelsea Walton YQI:[id number] DOB: 1940/10/01 DOA: 01/29/2023  PCP: Garlan Fillers, MD   Patient coming from: Home  Chief Complaint: Syncope  HPI: Chelsea Walton is a 82 y.o. female with medical history significant of hypertension, hyperlipidemia, GI bleed, rheumatoid arthritis, spinal stenosis presenting after syncope and collapse.  Patient initially had an episode last night.  She got up off the couch to kill a bug and she became lightheaded and she fell forward and hit her elbow and knee on a brick floor.  [facility] and found to have a right patella and right glenoid fracture.  Patient was placed in a knee immobilizer and a sling was discharged home after receiving dose of Norco.  She has not yet had a chance to get to sleep after the events overnight.  She had gotten up to go to the bathroom while she was at home and she became lightheaded and nauseous and she had to be assisted to the floor by her husband.  She did have brief loss of consciousness.  But no injury.  She denies fevers, chills, chest pain, shortness of breath, abdominal pain, constipation, diarrhea.  ED Course: Vital signs in the ED notable for blood pressure in the 130s to 140s systolic.  Lab workup included BMP with potassium 3.4, glucose 112.  Magnesium normal.  CBC within normal limits.  Troponin negative with repeat pending.  TSH normal.  Urinalysis pending.  As above previous workup showed right shoulder x-rayed with acute right glenoid fracture and acute nondisplaced right patella fracture.  X-ray of the hip showed no acute normality, chest x-ray showed no acute normality, CT head showed no acute abnormality at that time.  Patient received oxycodone, 500 cc IV fluids and 40 mill equivalents p.o. potassium in the ED.  Review of Systems: As per HPI otherwise all other systems reviewed and are negative.  Past Medical History:  Diagnosis Date   Hyperlipemia     Hypertension     Past Surgical History:  Procedure Laterality Date   CATARACT EXTRACTION     COLONOSCOPY WITH PROPOFOL N/A 02/01/2020   Procedure: COLONOSCOPY WITH PROPOFOL;  Surgeon: Iva Boop, MD;  Location: [facility] ENDOSCOPY;  Service: Gastroenterology;  Laterality: N/A;   ESOPHAGOGASTRODUODENOSCOPY (EGD) WITH PROPOFOL N/A 01/31/2020   Procedure: ESOPHAGOGASTRODUODENOSCOPY (EGD) WITH PROPOFOL;  Surgeon: Jeani Hawking, MD;  Location: [facility] ENDOSCOPY;  Service: Endoscopy;  Laterality: N/A;   GIVENS CAPSULE STUDY N/A 02/01/2020   Procedure: GIVENS CAPSULE STUDY;  Surgeon: Iva Boop, MD;  Location: [facility] ENDOSCOPY;  Service: Endoscopy;  Laterality: N/A;   HOT HEMOSTASIS N/A 01/31/2020   Procedure: HOT HEMOSTASIS (ARGON PLASMA COAGULATION/BICAP);  Surgeon: Jeani Hawking, MD;  Location: [facility] ENDOSCOPY;  Service: Endoscopy;  Laterality: N/A;   POLYPECTOMY  02/01/2020   Procedure: POLYPECTOMY;  Surgeon: Iva Boop, MD;  Location: [facility] ENDOSCOPY;  Service: Gastroenterology;;   TUBAL LIGATION      Social History  reports that she has never smoked. She has never used smokeless tobacco. She reports that she does not drink alcohol and does not use drugs.  Allergies  Allergen Reactions   Meloxicam Other (See Comments)    Intestinal bleeding    Tribenzor [Olmesartan-Amlodipine-Hctz] Nausea And Vomiting and Other (See Comments)    Smell and taste perversion (could not even tolerate "the smell of water")   Ibuprofen Other (See Comments)    Gi bleeding   Metoprolol Nausea And Vomiting and  Other (See Comments)    Body aches and chills, also    Family History  Problem Relation Age of Onset   Diabetes Mother    Heart failure Mother    Heart disease Mother    Diabetes Maternal Grandfather    Heart disease Father    Heart failure Father    Heart disease Brother        CABG and stents   Heart disease Paternal Aunt    Peripheral Artery Disease Brother    Colon cancer Neg Hx   Reviewed on  admission  Prior to Admission medications   Medication Sig Start Date End Date Taking? Authorizing Provider  acetaminophen (TYLENOL) 500 MG tablet Take 1,000 mg by mouth every 6 (six) hours as needed (for pain).    [provider]  amLODipine (NORVASC) 2.5 MG tablet Take 1 tablet (2.5 mg total) by mouth daily. 02/02/20   Zannie Cove, MD  doxylamine, Sleep, (UNISOM) 25 MG tablet Take 25 mg by mouth at bedtime.    [provider]  ferrous sulfate 325 (65 FE) MG EC tablet Take 1 tablet (325 mg total) by mouth 2 (two) times daily with a meal. 02/02/20   Zannie Cove, MD  gabapentin (NEURONTIN) 300 MG capsule Take 300 mg by mouth daily as needed (for hip pain).    [provider]  HYDROcodone-acetaminophen (NORCO/VICODIN) 5-325 MG tablet Take 1 tablet by mouth every 6 (six) hours as needed for severe pain. 01/29/23   Pollyann Savoy, MD  ibandronate (BONIVA) 150 MG tablet Take 150 mg by mouth every 30 (thirty) days. Take in the morning with a full glass of water, on an empty stomach, and do not take anything else by mouth or lie down for the next 30 min.    [provider]  pantoprazole (PROTONIX) 40 MG tablet Take 1 tablet (40 mg total) by mouth daily before breakfast. 05/26/20   Arnaldo Natal, NP  pravastatin (PRAVACHOL) 80 MG tablet Take 80 mg by mouth daily.  09/30/17   [place], Peter M, MD  vitamin C (ASCORBIC ACID) 250 MG tablet Take 1 tablet (250 mg total) by mouth daily. 02/02/20   Zannie Cove, MD    Physical Exam: Vitals:   01/29/23 1030 01/29/23 1036 01/29/23 1317 01/29/23 1539  BP:  (!) 145/85 133/88 137/82  Pulse:  85 96 100  Resp:  18 19 17   Temp:  98 F (36.7 C) 98.5 F (36.9 C)   TempSrc:  Oral Oral   SpO2:  98% 97% 93%  Weight: 51.3 kg     Height: 4\' 11"  (1.499 m)       Physical Exam Constitutional:      General: She is not in acute distress.    Appearance: Normal appearance.  HENT:     Head: Normocephalic and  atraumatic.     Mouth/Throat:     Mouth: Mucous membranes are moist.     Pharynx: Oropharynx is clear.  Eyes:     Extraocular Movements: Extraocular movements intact.     Pupils: Pupils are equal, round, and reactive to light.  Cardiovascular:     Rate and Rhythm: Normal rate and regular rhythm.     Pulses: Normal pulses.     Heart sounds: Normal heart sounds.  Pulmonary:     Effort: Pulmonary effort is normal. No respiratory distress.     Breath sounds: Normal breath sounds.  Abdominal:     General: Bowel sounds are normal. There is  no distension.     Palpations: Abdomen is soft.     Tenderness: There is no abdominal tenderness.  Musculoskeletal:        General: No swelling or deformity.     Comments: R shoulder sling and R knee brace  Skin:    General: Skin is warm and dry.  Neurological:     General: No focal deficit present.     Mental Status: Mental status is at baseline.    Labs on Admission: I have personally reviewed following labs and imaging studies  CBC: Recent Labs  Lab 01/29/23 1049  WBC 9.3  NEUTROABS 7.3  HGB 14.2  HCT 40.2  MCV 93.9  PLT 206    Basic Metabolic Panel: Recent Labs  Lab 01/29/23 1049  NA 139  K 3.4*  CL 104  CO2 25  GLUCOSE 112*  BUN 11  CREATININE 0.83  CALCIUM 8.9  MG 2.2    GFR: Estimated Creatinine Clearance: 36.9 mL/min (by C-G formula based on SCr of 0.83 mg/dL).  Liver Function Tests: No results for input(s): "AST", "ALT", "ALKPHOS", "BILITOT", "PROT", "ALBUMIN" in the last 168 hours.  Urine analysis: No results found for: "COLORURINE", "APPEARANCEUR", "LABSPEC", "PHURINE", "GLUCOSEU", "HGBUR", "BILIRUBINUR", "KETONESUR", "PROTEINUR", "UROBILINOGEN", "NITRITE", "LEUKOCYTESUR"  Radiological Exams on Admission: DG Chest Port 1 View  Result Date: 01/29/2023 CLINICAL DATA:  Syncope EXAM: PORTABLE CHEST 1 VIEW COMPARISON:  Chest radiograph 08/22/2017 FINDINGS: The cardiomediastinal silhouette is normal There is no  focal consolidation or pulmonary edema. There is no pleural effusion or pneumothorax There is no acute osseous abnormality. IMPRESSION: No radiographic evidence of acute cardiopulmonary process. Electronically Signed   By: Lesia Hausen M.D.   On: 01/29/2023 11:34   CT Head Wo Contrast  Result Date: 01/29/2023 CLINICAL DATA:  Fall EXAM: CT HEAD WITHOUT CONTRAST TECHNIQUE: Contiguous axial images were obtained from the base of the skull through the vertex without intravenous contrast. RADIATION DOSE REDUCTION: This exam was performed according to the departmental dose-optimization program which includes automated exposure control, adjustment of the mA and/or kV according to patient size and/or use of iterative reconstruction technique. COMPARISON:  None Available. FINDINGS: Brain: No intracranial hemorrhage, mass effect, or evidence of acute infarct. No hydrocephalus. No extra-axial fluid collection. Generalized cerebral atrophy. Ill-defined hypoattenuation within the cerebral white matter is nonspecific but consistent with chronic small vessel ischemic disease. Vascular: No hyperdense vessel. Intracranial arterial calcification. Skull: No fracture or focal lesion. Sinuses/Orbits: No acute finding. Paranasal sinuses and mastoid air cells are well aerated. Other: None. IMPRESSION: 1. No acute intracranial abnormality. Electronically Signed   By: Minerva Fester M.D.   On: 01/29/2023 03:29   DG Knee Complete 4 Views Right  Result Date: 01/29/2023 CLINICAL DATA:  Status post fall. EXAM: RIGHT KNEE - COMPLETE 4+ VIEW COMPARISON:  None Available. FINDINGS: An acute, nondisplaced fracture deformity is seen extending through the inferior aspect of the right patella. There is no evidence of dislocation. A moderate to large joint effusion is seen. Moderate severity soft tissue swelling is seen anterior to the right patella. IMPRESSION: 1. Acute, nondisplaced fracture of the right patella. 2. Moderate to large joint  effusion. Electronically Signed   By: Aram Candela M.D.   On: 01/29/2023 01:17   DG Hip Unilat W or Wo Pelvis 2-3 Views Right  Result Date: 01/29/2023 CLINICAL DATA:  Status post fall. EXAM: DG HIP (WITH OR WITHOUT PELVIS) 2-3V RIGHT COMPARISON:  None Available. FINDINGS: There is no evidence of hip fracture or  dislocation. Degenerative changes seen involving both hips, in the form of joint space narrowing and acetabular sclerosis. IMPRESSION: Degenerative changes without evidence of acute fracture or dislocation. Electronically Signed   By: Aram Candela M.D.   On: 01/29/2023 01:15   DG Shoulder Right  Result Date: 01/29/2023 CLINICAL DATA:  Status post fall. EXAM: RIGHT SHOULDER - 2+ VIEW COMPARISON:  None Available. FINDINGS: There is acute fracture deformity involving the inferior aspect of the right glenoid. There is no evidence of dislocation. Moderate severity degenerative changes are seen involving the right acromioclavicular joint. Soft tissues are unremarkable. IMPRESSION: Acute fracture of the inferior aspect of the right glenoid. Electronically Signed   By: Aram Candela M.D.   On: 01/29/2023 01:14    EKG: Independently reviewed.  Sinus rhythm at 82 bpm.  Significant baseline artifact leading EKG to be read by computer as atrial flutter.  Nonspecific T wave flattening.  Assessment/Plan Principal Problem:   Syncope and collapse Active Problems:   Essential hypertension   Dyslipidemia   Syncope and collapse Right glenoid fracture Right patella fracture > Patient presenting after 2 episodes of near syncope/syncope.  Initially had near syncope and had a fall on hit her right side on brick floor, she was worked up at [facility] for this, and found to have right patella fracture and right glenoid fracture.  Placed in sling and knee immobilizer. > After returning home patient has had recurrent episode where she became lightheaded and nauseous but was able to be  assisted to the ground this time.  Did lose consciousness during second event. > Due to recurrent episodes patient uncomfortable going home and we will proceed with syncope workup and patient.  Prodrome suspicious for vasovagal versus orthostatic. - Monitor on telemetry overnight - Orthostatic vital signs - Echocardiogram - Continue with knee immobilizer and sling  Hypertension - Continue home amlodipine  Hyperlipidemia - Continue home pravastatin  DVT prophylaxis: Lovenox Code Status:   Full Family Communication:  None on admission  Disposition Plan:   Patient is from:  Home  Anticipated DC to:  Home  Anticipated DC date:  1 to 2 days  Anticipated DC barriers: None  Consults called:  None Admission status:  Observation, telemetry  Severity of Illness: The appropriate patient status for this patient is OBSERVATION. Observation status is judged to be reasonable and necessary in order to provide the required intensity of service to ensure the patient's safety. The patient's presenting symptoms, physical exam findings, and initial radiographic and laboratory data in the context of their medical condition is felt to place them at decreased risk for further clinical deterioration. Furthermore, it is anticipated that the patient will be medically stable for discharge from the hospital within 2 midnights of admission.    Synetta Fail MD [facility]  How to contact the [facility] Attending or Consulting provider 7A - 7P or covering provider during after hours 7P -7A, for this patient?   Check the care team in [facility] and look for a) attending/consulting TR[facility] provider listed and b) the [facility] team listed Log into [email or web address] and use [place]'s universal password to access. If you do not have the password, please contact the hospital operator. Locate the [facility] provider you are looking for under [facility] and page to a number that you can be directly reached. If you still have  difficulty reaching the provider, please page the [facility] (Director on Call) for the Hospitalists listed on amion for assistance.  01/29/2023, 3:45  PM    

## 2023-01-29 NOTE — ED Provider Notes (Signed)
[place] EMERGENCY DEPARTMENT AT [facility]  Provider Note  CSN: [id number] Arrival date & time: 01/29/23 0008  History Chief Complaint  Patient presents with   [name]    Chelsea Walton is a 82 y.o. female reports she was sitting on the couch at her home when she saw a bug crawling on the floor. She stood up to squash the bug when she lost her balance and fell forward on her knees and elbows and hitting her head on the glass door. She did not have LOC. Complaining of R shoulder, hip and knee pain. Able to bear weight with max assist from husband.   Home Medications Prior to Admission medications   Medication Sig Start Date End Date Taking? Authorizing Provider  HYDROcodone-acetaminophen (NORCO/VICODIN) 5-325 MG tablet Take 1 tablet by mouth every 6 (six) hours as needed for severe pain. 01/29/23  Yes Pollyann Savoy, MD  acetaminophen (TYLENOL) 500 MG tablet Take 1,000 mg by mouth every 6 (six) hours as needed (for pain).    [provider]  amLODipine (NORVASC) 2.5 MG tablet Take 1 tablet (2.5 mg total) by mouth daily. 02/02/20   Zannie Cove, MD  doxylamine, Sleep, (UNISOM) 25 MG tablet Take 25 mg by mouth at bedtime.    [provider]  ferrous sulfate 325 (65 FE) MG EC tablet Take 1 tablet (325 mg total) by mouth 2 (two) times daily with a meal. 02/02/20   Zannie Cove, MD  gabapentin (NEURONTIN) 300 MG capsule Take 300 mg by mouth daily as needed (for hip pain).    [provider]  ibandronate (BONIVA) 150 MG tablet Take 150 mg by mouth every 30 (thirty) days. Take in the morning with a full glass of water, on an empty stomach, and do not take anything else by mouth or lie down for the next 30 min.    [provider]  pantoprazole (PROTONIX) 40 MG tablet Take 1 tablet (40 mg total) by mouth daily before breakfast. 05/26/20   Arnaldo Natal, NP  pravastatin (PRAVACHOL) 80 MG tablet Take 80 mg by mouth daily.  09/30/17    [place], Peter M, MD  vitamin C (ASCORBIC ACID) 250 MG tablet Take 1 tablet (250 mg total) by mouth daily. 02/02/20   Zannie Cove, MD     Allergies    Meloxicam, Tribenzor [olmesartan-amlodipine-hctz], Ibuprofen, and Metoprolol   Review of Systems   Review of Systems Please see HPI for pertinent positives and negatives  Physical Exam BP (!) 124/94   Pulse (!) 103   Temp 98 F (36.7 C)   Resp 18   Ht 4\' 11"  (1.499 m)   Wt 51.3 kg   SpO2 95%   BMI 22.82 kg/m   Physical Exam Vitals and nursing note reviewed.  Constitutional:      Appearance: Normal appearance.  HENT:     Head: Normocephalic and atraumatic.     Nose: Nose normal.     Mouth/Throat:     Mouth: Mucous membranes are moist.  Eyes:     Extraocular Movements: Extraocular movements intact.     Conjunctiva/sclera: Conjunctivae normal.  Cardiovascular:     Rate and Rhythm: Normal rate.  Pulmonary:     Effort: Pulmonary effort is normal.     Breath sounds: Normal breath sounds.  Abdominal:     General: Abdomen is flat.     Palpations: Abdomen is soft.     Tenderness: There is no abdominal tenderness.  Musculoskeletal:  General: Tenderness (R anterior knee) present. No swelling.     Cervical back: Neck supple. No tenderness.     Comments: Pain with ROM of R shoulder, limited adduction. Unable to ROM R knee. No bony tenderness of R hip.   Skin:    General: Skin is warm and dry.     Comments: Abrasion L elbow  Neurological:     General: No focal deficit present.     Mental Status: She is alert.  Psychiatric:        Mood and Affect: Mood normal.     ED Results / Procedures / Treatments   EKG None  Procedures Procedures  Medications Ordered in the ED Medications  HYDROcodone-acetaminophen (NORCO/VICODIN) 5-325 MG per tablet 1 tablet (1 tablet Oral Given 01/29/23 0249)    Initial Impression and Plan  Patient here after a fall including a head injury. Did not have LOC, just lost her balance  while trying to squash a bug. I personally viewed the images from radiology studies and agree with radiologist interpretation: Xrays show a R glenoid fracture and R patellar fracture. Will add CT head given head injury. Plan pain meds, knee brace and sling. If able to bear weight, may be able to go home with outpatient ortho follow up. Otherwise may require admission.   ED Course   Clinical Course as of 01/29/23 0426  Tue Jan 29, 2023  3474 I personally viewed the images from radiology studies and agree with radiologist interpretation: CT is neg for acute injury.  [CS]  0423 Patient able to bear weight with knee brace and cane. Comfortable going home. Recommend close outpatient ortho follow up. Rx for Norco as needed. RTED for any other concerns. [CS]    Clinical Course User Index [CS] Pollyann Savoy, MD     MDM Rules/Calculators/A&P Medical Decision Making Given presenting complaint, I considered that admission might be necessary. After review of results from ED lab and/or imaging studies, admission to the hospital is not indicated at this time.    Problems Addressed: Closed nondisplaced fracture of right patella, unspecified fracture morphology, initial encounter: acute illness or injury Fall, initial encounter: acute illness or injury Glenoid fracture of shoulder, right, closed, initial encounter: acute illness or injury  Amount and/or Complexity of Data Reviewed Radiology: ordered and independent interpretation performed. Decision-making details documented in ED Course.  Risk Prescription drug management. Decision regarding hospitalization.     Final Clinical Impression(s) / ED Diagnoses Final diagnoses:  Fall, initial encounter  Glenoid fracture of shoulder, right, closed, initial encounter  Closed nondisplaced fracture of right patella, unspecified fracture morphology, initial encounter    Rx / DC Orders ED Discharge Orders          Ordered     HYDROcodone-acetaminophen (NORCO/VICODIN) 5-325 MG tablet  Every 6 hours PRN        01/29/23 0425             Pollyann Savoy, MD 01/29/23 [id number]

## 2023-01-29 NOTE — ED Triage Notes (Addendum)
Bent over to kill a bug and fell onto both knees. Hitting a brick patio floor C/O rt. Knee, shoulder and hip pain -LOC, no blood thinners Occurred at 2300 Alert and oriented in [facility]

## 2023-01-29 NOTE — ED Triage Notes (Signed)
Per [facility] pt coming from home after having witnessed syncopal episode by husband. Patient states while in the bathroom began feeling very hot, faint and nauseated. States she called her husband in there and he helped lower her to the ground then lost consciousness and threw up. Given 200 CC NS en route. Took extra strength tylenol earlier this morning. Also was given vicodin around 3am at [facility]. C/o pain to right shoulder and right knee that were broken and seen at hospital last night.

## 2023-01-30 ENCOUNTER — Observation Stay (HOSPITAL_BASED_OUTPATIENT_CLINIC_OR_DEPARTMENT_OTHER): Payer: Medicare HMO

## 2023-01-30 DIAGNOSIS — R55 Syncope and collapse: Secondary | ICD-10-CM | POA: Diagnosis not present

## 2023-01-30 DIAGNOSIS — I10 Essential (primary) hypertension: Secondary | ICD-10-CM | POA: Diagnosis not present

## 2023-01-30 DIAGNOSIS — E785 Hyperlipidemia, unspecified: Secondary | ICD-10-CM | POA: Diagnosis not present

## 2023-01-30 LAB — COMPREHENSIVE METABOLIC PANEL
ALT: 22 U/L (ref 0–44)
AST: 17 U/L (ref 15–41)
Albumin: 3.8 g/dL (ref 3.5–5.0)
Alkaline Phosphatase: 52 U/L (ref 38–126)
Anion gap: 8 (ref 5–15)
BUN: 9 mg/dL (ref 8–23)
CO2: 23 mmol/L (ref 22–32)
Calcium: 8.7 mg/dL — ABNORMAL LOW (ref 8.9–10.3)
Chloride: 106 mmol/L (ref 98–111)
Creatinine, Ser: 0.85 mg/dL (ref 0.44–1.00)
GFR, Estimated: >60 mL/min (ref >60)
Glucose, Bld: 108 mg/dL — ABNORMAL HIGH (ref 70–99)
Potassium: 3.7 mmol/L (ref 3.5–5.1)
Sodium: 137 mmol/L (ref 135–145)
Total Bilirubin: 1.2 mg/dL (ref 0.3–1.2)
Total Protein: 5.9 g/dL — ABNORMAL LOW (ref 6.5–8.1)

## 2023-01-30 LAB — CBC
HCT: 38.3 % (ref 36.0–46.0)
Hemoglobin: 13.4 g/dL (ref 12.0–15.0)
MCH: 33.3 pg (ref 26.0–34.0)
MCHC: 35 g/dL (ref 30.0–36.0)
MCV: 95 fL (ref 80.0–100.0)
Platelets: 206 10*3/uL (ref 150–400)
RBC: 4.03 MIL/uL (ref 3.87–5.11)
RDW: 13 % (ref 11.5–15.5)
WBC: 8.2 10*3/uL (ref 4.0–10.5)
nRBC: 0 % (ref 0.0–0.2)

## 2023-01-30 LAB — ECHOCARDIOGRAM COMPLETE
AR max vel: 2.55 cm2
AV Area VTI: 2.39 cm2
AV Area mean vel: 2.38 cm2
AV Mean grad: 5 mmHg
AV Peak grad: 8.9 mmHg
AV Vena cont: 0.2 cm
Ao pk vel: 1.49 m/s
Area-P 1/2: 4.8 cm2
Height: 59 in
P 1/2 time: 625 msec
S' Lateral: 2.8 cm
Weight: 1808 oz

## 2023-01-30 LAB — MAGNESIUM: Magnesium: 2.2 mg/dL (ref 1.7–2.4)

## 2023-01-30 MED ORDER — SODIUM CHLORIDE 0.9 % IV SOLN: INTRAVENOUS | Status: DC

## 2023-01-30 MED ORDER — ZOLPIDEM TARTRATE 5 MG PO TABS
5.0000 mg | ORAL_TABLET | Freq: Every evening | ORAL | Status: DC | PRN
  Administered 2023-01-30: 5 mg via ORAL
  Filled 2023-01-30: qty 1

## 2023-01-30 MED ORDER — SODIUM CHLORIDE 0.9 % IV BOLUS
1000.0000 mL | Freq: Once | INTRAVENOUS | Status: AC
  Administered 2023-01-30: 1000 mL via INTRAVENOUS

## 2023-01-30 MED ORDER — POTASSIUM CHLORIDE CRYS ER 20 MEQ PO TBCR
40.0000 meq | EXTENDED_RELEASE_TABLET | Freq: Once | ORAL | Status: AC
  Administered 2023-01-30: 40 meq via ORAL
  Filled 2023-01-30: qty 2

## 2023-01-30 NOTE — Hospital Course (Addendum)
The patient is an 82 year old Caucasian female with past medical history significant for prolonged to hypertension, hyperlipidemia, history of GI bleeds, history of rheumatoid arthritis, history of spinal stenosis as well as other comorbidities presented after syncope and collapse.  She initially had an episode last night and she got up off the couch to kill a bug and when she bent over she became lightheaded and fell forward and hit her elbow and her knee on the bed floor.  She is evaluated in [facility] and was found to have a right patella and right glenoid fracture and was placed in a knee immobilizer as well as a sling and was discharged home after she received some Norco.  She had not had a chance to get sleep overnight and had gotten up to go to the bathroom and became lightheaded and nauseous and was dizzy and had to be assisted to the floor by her husband.  Per report she did briefly lose consciousness.  No injury given that she send on the the wall.  Because of this she was brought back to the emergency room and lab work was obtained and troponin was negative.  TSH was normal and CT of the head showed no acute abnormality at that time.  She received 500 mL fluid bolus and was admitted for syncopal workup.  Echocardiogram was obtained and had a normal EF but grade 1 diastolic dysfunction.  She was doing much better today however she had a recurrent episode with PT and did drop her blood pressure however did not meet parameters for orthostasis but was symptomatic with increased pallor, diaphoresis and concerns of nausea vomiting so ambulation was deferred.  Assessment and Plan:  Syncope and collapse with recurrent near syncope in the setting of orthostasis and vasovagal syncope Right glenoid fracture Right patella fracture -Patient presenting after 2 episodes of near syncope/syncope.  Initially had near syncope and had a fall on hit her right side on brick floor, she was worked up at  [facility] for this, and found to have right patella fracture and right glenoid fracture.  Placed in sling and knee immobilizer. -After returning home patient has had recurrent episode where she became lightheaded and nauseous but was able to be assisted to the ground this time.  Did lose consciousness during second event. -Due to recurrent episodes patient uncomfortable going home and we will proceed with syncope workup and patient.  Prodrome suspicious for vasovagal versus orthostatic. -Continue to Monitor on telemetry overnight -Orthostatic vital signs -Ordered Echocardiogram and showed "Left ventricular ejection fraction, by estimation, is 60 to 65%. The left ventricle has normal function. The left ventricle has no regional wall motion abnormalities. Left ventricular diastolic parameters are  consistent with Grade I diastolic dysfunction (impaired relaxation). " -Troponins were flat and negative x 2 as it was 3 -PT recommending outpatient physical therapy -Continue with knee immobilizer and sling -Patient became extremely diaphoretic and dizzy when working with therapy so we will give her a 1-1/2 L bolus and started on maintenance IV fluids and ordered TED hose and abdominal binder -Vestibular PT not done given that she did not have any vestibular symptoms and orthostatics and vital signs done and she did fairly well -Continue monitor and trend will send her home with a 30-day Holter monitor -He is medically stable for discharge and will need to follow-up with PCP  Hypertension -Continue home Amlodipine 2.5 mg po Daily -Continue to Monitor BP per Protocol -Last BP Reading was  137/83   Hyperlipidemia -Continue home Pravastatin 80 mg po Daily  Rheumatoid Arthritis  -Held Methotrexate while hospitalized and can resume as an outpatient    Hypokalemia -Patient's K+ Level Trend: Recent Labs  Lab 01/29/23 1049 01/30/23 0124 01/31/23 0855  K 3.4* 3.7 3.8  -Continue to Monitor  and Replete as Necessary -Repeat CMP within a week  GERD/GI Prophylaxis -C/w Pantoprazole 40 mg po Daily

## 2023-01-30 NOTE — TOC CAGE-AID Note (Signed)
Transition of Care [facility]) - CAGE-AID Screening   Patient Details  Name: Chelsea Walton MRN: [id number] Date of Birth: [birth year]  Transition of Care [facility]) CM/SW Contact:    Janora Norlander, RN Phone Number: [phone] 01/30/2023, 11:29 AM   Clinical Narrative: Pt denies alcohol or drug use.  Screening complete and no resources needed.   CAGE-AID Screening:    Have You Ever Felt You Ought to Cut Down on Your Drinking or Drug Use?: No Have People Annoyed You By Critizing Your Drinking Or Drug Use?: No Have You Felt Bad Or Guilty About Your Drinking Or Drug Use?: No Have You Ever Had a Drink or Used Drugs First Thing In The Morning to Steady Your Nerves or to Get Rid of a Hangover?: No CAGE-AID Score: 0  Substance Abuse Education Offered: No

## 2023-01-30 NOTE — Progress Notes (Signed)
PROGRESS NOTE    Chelsea Walton  ZOX:[id number] DOB: 09-03-1940 DOA: 01/29/2023 PCP: Garlan Fillers, MD   Brief Narrative:    Patient is an 82 year old Caucasian female with past medical history significant for prolonged to hypertension, hyperlipidemia, history of GI bleeds, history of rheumatoid arthritis, history of spinal stenosis as well as other comorbidities presented after syncope and collapse.  She initially had an episode last night and she got up off the couch to kill a bug and when she bent over she became lightheaded and fell forward and hit her elbow and her knee on the bed floor.  She is evaluated in [facility] and was found to have a right patella and right glenoid fracture and was placed in a knee immobilizer as well as a sling and was discharged home after she received some Norco.  She had not had a chance to get sleep overnight and had gotten up to go to the bathroom and became lightheaded and nauseous and was dizzy and had to be assisted to the floor by her husband.  Per report she did briefly lose consciousness.  No injury given that she send on the the wall.  Because of this she was brought back to the emergency room and lab work was obtained and troponin was negative.  TSH was normal and CT of the head showed no acute abnormality at that time.  She received 500 mL fluid bolus and was admitted for syncopal workup.  Echocardiogram was obtained and had a normal EF but grade 1 diastolic dysfunction.  She was doing much better today however she had a recurrent episode with PT and did drop her blood pressure however did not meet parameters for orthostasis but was symptomatic with increased pallor, diaphoresis and concerns of nausea vomiting so ambulation was deferred.  Assessment and Plan:  Syncope and collapse with recurrent near syncope Right glenoid fracture Right patella fracture -Patient presenting after 2 episodes of near syncope/syncope.  Initially had near  syncope and had a fall on hit her right side on brick floor, she was worked up at [facility] for this, and found to have right patella fracture and right glenoid fracture.  Placed in sling and knee immobilizer. -After returning home patient has had recurrent episode where she became lightheaded and nauseous but was able to be assisted to the ground this time.  Did lose consciousness during second event. -Due to recurrent episodes patient uncomfortable going home and we will proceed with syncope workup and patient.  Prodrome suspicious for vasovagal versus orthostatic. -Continue to Monitor on telemetry overnight -Orthostatic vital signs -Ordered Echocardiogram and showed "Left ventricular ejection fraction, by estimation, is 60 to 65%. The left ventricle has normal function. The left ventricle has no regional wall motion abnormalities. Left ventricular diastolic parameters are  consistent with Grade I diastolic dysfunction (impaired relaxation). " -Troponins were flat and negative x 2 as it was 3 -PT recommending outpatient physical therapy -Continue with knee immobilizer and sling -Patient became extremely diaphoretic and dizzy when working with therapy so we will give her a 1-1/2 L bolus and started on maintenance IV fluids and ordered TED hose and abdominal binder -Will repeat orthostatic vital signs in the morning and also obtain a vestibular PT consult -Continue monitor and trend and if this persist may need further cardiac evaluation and will definitely send her home with a Holter monitor  Hypertension -Continue home Amlodipine 2.5 mg po Daily -Continue to Monitor BP per  Protocol -Last BP Reading was 132/84   Hyperlipidemia -Continue home Pravastatin 80 mg po Daily   Hypokalemia -Patient's K+ Level Trend: Recent Labs  Lab 01/29/23 1049 01/30/23 0124  K 3.4* 3.7  -Replete with po Kcl 40 mEQ x1 today -Check Mag Level in the AM -Continue to Monitor and Replete as  Necessary -Repeat CMP in the AM   GERD/GI Prophylaxis -C/w Pantoprazole 40 mg po Daily    DVT prophylaxis: Place TED hose Start: 01/30/23 1028 enoxaparin (LOVENOX) injection 40 mg Start: 01/29/23 1545    Code Status: Full Code Family Communication: No family at bedside  Disposition Plan:  Level of care: Telemetry Medical Status is: Observation The patient will require care spanning > 2 midnights and should be moved to inpatient because: Continues to be symptomatic and had recurrent dizziness   Consultants:  None  Procedures:  ECHOCARDIOGRAM IMPRESSIONS     1. Left ventricular ejection fraction, by estimation, is 60 to 65%. The  left ventricle has normal function. The left ventricle has no regional  wall motion abnormalities. Left ventricular diastolic parameters are  consistent with Grade I diastolic  dysfunction (impaired relaxation).   2. Right ventricular systolic function is normal. The right ventricular  size is normal. There is normal pulmonary artery systolic pressure. The  estimated right ventricular systolic pressure is 28.8 mmHg.   3. The mitral valve is grossly normal. Mild mitral valve regurgitation.  No evidence of mitral stenosis. There is mild late systolic prolapse of  the middle scallop of the posterior leaflet of the mitral valve.   4. The aortic valve is tricuspid. Aortic valve regurgitation is mild. No  aortic stenosis is present.   5. The inferior vena cava is normal in size with greater than 50%  respiratory variability, suggesting right atrial pressure of 3 mmHg.   FINDINGS   Left Ventricle: Left ventricular ejection fraction, by estimation, is 60  to 65%. The left ventricle has normal function. The left ventricle has no  regional wall motion abnormalities. The left ventricular internal cavity  size was normal in size. There is   no left ventricular hypertrophy. Left ventricular diastolic parameters  are consistent with Grade I diastolic  dysfunction (impaired relaxation).   Right Ventricle: The right ventricular size is normal. No increase in  right ventricular wall thickness. Right ventricular systolic function is  normal. There is normal pulmonary artery systolic pressure. The tricuspid  regurgitant velocity is 2.54 m/s, and   with an assumed right atrial pressure of 3 mmHg, the estimated right  ventricular systolic pressure is 28.8 mmHg.   Left Atrium: Left atrial size was normal in size.   Right Atrium: Right atrial size was normal in size.   Pericardium: There is no evidence of pericardial effusion.   Mitral Valve: The mitral valve is grossly normal. There is mild late  systolic prolapse of the middle scallop of the posterior leaflet of the  mitral valve. Mild mitral valve regurgitation. No evidence of mitral valve  stenosis.   Tricuspid Valve: The tricuspid valve is grossly normal. Tricuspid valve  regurgitation is mild . No evidence of tricuspid stenosis.   Aortic Valve: The aortic valve is tricuspid. Aortic valve regurgitation is  mild. Aortic regurgitation PHT measures 625 msec. No aortic stenosis is  present. Aortic valve mean gradient measures 5.0 mmHg. Aortic valve peak  gradient measures 8.9 mmHg. Aortic   valve area, by VTI measures 2.39 cm.   Pulmonic Valve: The pulmonic valve was grossly normal.  Pulmonic valve  regurgitation is not visualized. No evidence of pulmonic stenosis.   Aorta: The aortic root and ascending aorta are structurally normal, with  no evidence of dilitation.   Venous: The inferior vena cava is normal in size with greater than 50%  respiratory variability, suggesting right atrial pressure of 3 mmHg.   IAS/Shunts: The atrial septum is grossly normal.     LEFT VENTRICLE  PLAX 2D  LVIDd:         4.30 cm   Diastology  LVIDs:         2.80 cm   LV e' medial:    6.42 cm/s  LV PW:         1.00 cm   LV E/e' medial:  11.7  LV IVS:        0.80 cm   LV e' lateral:   8.16 cm/s   LVOT diam:     1.90 cm   LV E/e' lateral: 9.2  LV SV:         65  LV SV Index:   45  LVOT Area:     2.84 cm     RIGHT VENTRICLE            IVC  RV Basal diam:  2.00 cm    IVC diam: 1.10 cm  RV Mid diam:    1.70 cm  RV S prime:     9.90 cm/s  TAPSE (M-mode): 2.4 cm   LEFT ATRIUM             Index        RIGHT ATRIUM           Index  LA diam:        3.40 cm 2.35 cm/m   RA Area:     14.20 cm  LA Vol (A2C):   28.5 ml 19.70 ml/m  RA Volume:   30.20 ml  20.87 ml/m  LA Vol (A4C):   23.0 ml 15.90 ml/m  LA Biplane Vol: 25.8 ml 17.83 ml/m   AORTIC VALVE                    PULMONIC VALVE  AV Area (Vmax):    2.55 cm     PV Vmax:       0.96 m/s  AV Area (Vmean):   2.38 cm     PV Peak grad:  3.7 mmHg  AV Area (VTI):     2.39 cm  AV Vmax:           149.00 cm/s  AV Vmean:          98.500 cm/s  AV VTI:            0.272 m  AV Peak Grad:      8.9 mmHg  AV Mean Grad:      5.0 mmHg  LVOT Vmax:         134.00 cm/s  LVOT Vmean:        82.600 cm/s  LVOT VTI:          0.229 m  LVOT/AV VTI ratio: 0.84  AI PHT:            625 msec  AR Vena Contracta: 0.20 cm    AORTA  Ao Root diam: 2.80 cm  Ao Asc diam:  3.40 cm   MITRAL VALVE                TRICUSPID VALVE  MV Area (  PHT): 4.80 cm     TR Peak grad:   25.8 mmHg  MV Decel Time: 158 msec     TR Vmax:        254.00 cm/s  MV E velocity: 75.10 cm/s  MV A velocity: 101.00 cm/s  SHUNTS  MV E/A ratio:  0.74         Systemic VTI:  0.23 m                              Systemic Diam: 1.90 cm   Antimicrobials:  Anti-infectives (From admission, onward)    None       Subjective: Seen and examined and was doing better this morning however she then became symptomatic again with working with physical therapy.  She was given another 1-1/2 L boluses.  Denies any chest pain but was lightheaded and felt dizzy with her evaluation and PT.  Objective: Vitals:   01/29/23 2316 01/30/23 0344 01/30/23 0817 01/30/23 1532  BP:  132/84 137/86 125/71   Pulse: 80 78 73 80  Resp:   17 17  Temp:  98 F (36.7 C) 97.7 F (36.5 C) 98.5 F (36.9 C)  TempSrc:  Oral Oral Oral  SpO2: 95% 96% 95% 96%  Weight:      Height:        Intake/Output Summary (Last 24 hours) at 01/30/2023 1618 Last data filed at 01/30/2023 1230 Gross per 24 hour  Intake 1200 ml  Output 925 ml  Net 275 ml   Filed Weights   01/29/23 1030  Weight: 51.3 kg   Examination: Physical Exam:  Constitutional: Thin elderly Caucasian female who is appearing in no acute distress but feels well comfortable es, normal ROM, no appreciable thyromegaly Respiratory: Diminished to auscultation bilaterally, no wheezing, rales, rhonchi or crackles. Normal respiratory effort and patient is not tachypenic. No accessory muscle use.  Unlabored breathing Cardiovascular: RRR, no murmurs / rubs / gallops. S1 and S2 auscultated. No extremity edema. Abdomen: Soft, non-tender, non-distended.  Bowel sounds positive.  GU: Deferred. Musculoskeletal: Knee immobilizer and arm is in a sling Skin: No rashes, lesions, ulcers on limited skin evaluation. No induration; Warm and dry.  Neurologic: CN 2-12 grossly intact with no focal deficits. Romberg sign and cerebellar reflexes not assessed.  Psychiatric: Normal judgment and insight. Alert and oriented x 3. Normal mood and appropriate affect.   Data Reviewed: I have personally reviewed following labs and imaging studies  CBC: Recent Labs  Lab 01/29/23 1049 01/30/23 0124  WBC 9.3 8.2  NEUTROABS 7.3  --   HGB 14.2 13.4  HCT 40.2 38.3  MCV 93.9 95.0  PLT 206 206   Basic Metabolic Panel: Recent Labs  Lab 01/29/23 1049 01/30/23 0124  NA 139 137  K 3.4* 3.7  CL 104 106  CO2 25 23  GLUCOSE 112* 108*  BUN 11 9  CREATININE 0.83 0.85  CALCIUM 8.9 8.7*  MG 2.2 2.2   GFR: Estimated Creatinine Clearance: 36 mL/min (by C-G formula based on SCr of 0.85 mg/dL). Liver Function Tests: Recent Labs  Lab 01/30/23 0124  AST 17  ALT 22   ALKPHOS 52  BILITOT 1.2  PROT 5.9*  ALBUMIN 3.8   No results for input(s): "LIPASE", "AMYLASE" in the last 168 hours. No results for input(s): "AMMONIA" in the last 168 hours. Coagulation Profile: No results for input(s): "INR", "PROTIME" in the last 168 hours. Cardiac Enzymes: No results for  input(s): "CKTOTAL", "CKMB", "CKMBINDEX", "TROPONINI" in the last 168 hours. BNP (last 3 results) No results for input(s): "PROBNP" in the last 8760 hours. HbA1C: No results for input(s): "HGBA1C" in the last 72 hours. CBG: Recent Labs  Lab 01/29/23 1253  GLUCAP 101*   Lipid Profile: No results for input(s): "CHOL", "HDL", "LDLCALC", "TRIG", "CHOLHDL", "LDLDIRECT" in the last 72 hours. Thyroid Function Tests: Recent Labs    01/29/23 1100  TSH 3.077   Anemia Panel: No results for input(s): "VITAMINB12", "FOLATE", "FERRITIN", "TIBC", "IRON", "RETICCTPCT" in the last 72 hours. Sepsis Labs: No results for input(s): "PROCALCITON", "LATICACIDVEN" in the last 168 hours.  No results found for this or any previous visit (from the past 240 hour(s)).   Radiology Studies: ECHOCARDIOGRAM COMPLETE  Result Date: 01/30/2023    ECHOCARDIOGRAM REPORT   Patient Name:   JERILEE SPACE Date of Exam: 01/30/2023 Medical Rec #:  [id number]         Height:       59.0 in Accession #:    [id number]        Weight:       113.0 lb Date of Birth:  02/14/1941          BSA:          1.447 m Patient Age:    81 years          BP:           137/86 mmHg Patient Gender: F                 HR:           77 bpm. Exam Location:  Inpatient Procedure: 2D Echo, Cardiac Doppler and Color Doppler Indications:    Syncope R55  History:        Patient has no prior history of Echocardiogram examinations.                 Signs/Symptoms:Syncope; Risk Factors:Hypertension, Dyslipidemia                 and Non-Smoker.  Sonographer:    Dondra Prader RVT RCS Referring Phys: Cecille Po MELVIN IMPRESSIONS  1. Left ventricular ejection fraction,  by estimation, is 60 to 65%. The left ventricle has normal function. The left ventricle has no regional wall motion abnormalities. Left ventricular diastolic parameters are consistent with Grade I diastolic dysfunction (impaired relaxation).  2. Right ventricular systolic function is normal. The right ventricular size is normal. There is normal pulmonary artery systolic pressure. The estimated right ventricular systolic pressure is 28.8 mmHg.  3. The mitral valve is grossly normal. Mild mitral valve regurgitation. No evidence of mitral stenosis. There is mild late systolic prolapse of the middle scallop of the posterior leaflet of the mitral valve.  4. The aortic valve is tricuspid. Aortic valve regurgitation is mild. No aortic stenosis is present.  5. The inferior vena cava is normal in size with greater than 50% respiratory variability, suggesting right atrial pressure of 3 mmHg. FINDINGS  Left Ventricle: Left ventricular ejection fraction, by estimation, is 60 to 65%. The left ventricle has normal function. The left ventricle has no regional wall motion abnormalities. The left ventricular internal cavity size was normal in size. There is  no left ventricular hypertrophy. Left ventricular diastolic parameters are consistent with Grade I diastolic dysfunction (impaired relaxation). Right Ventricle: The right ventricular size is normal. No increase in right ventricular wall thickness. Right ventricular systolic function is normal. There is normal  pulmonary artery systolic pressure. The tricuspid regurgitant velocity is 2.54 m/s, and  with an assumed right atrial pressure of 3 mmHg, the estimated right ventricular systolic pressure is 28.8 mmHg. Left Atrium: Left atrial size was normal in size. Right Atrium: Right atrial size was normal in size. Pericardium: There is no evidence of pericardial effusion. Mitral Valve: The mitral valve is grossly normal. There is mild late systolic prolapse of the middle scallop of the  posterior leaflet of the mitral valve. Mild mitral valve regurgitation. No evidence of mitral valve stenosis. Tricuspid Valve: The tricuspid valve is grossly normal. Tricuspid valve regurgitation is mild . No evidence of tricuspid stenosis. Aortic Valve: The aortic valve is tricuspid. Aortic valve regurgitation is mild. Aortic regurgitation PHT measures 625 msec. No aortic stenosis is present. Aortic valve mean gradient measures 5.0 mmHg. Aortic valve peak gradient measures 8.9 mmHg. Aortic  valve area, by VTI measures 2.39 cm. Pulmonic Valve: The pulmonic valve was grossly normal. Pulmonic valve regurgitation is not visualized. No evidence of pulmonic stenosis. Aorta: The aortic root and ascending aorta are structurally normal, with no evidence of dilitation. Venous: The inferior vena cava is normal in size with greater than 50% respiratory variability, suggesting right atrial pressure of 3 mmHg. IAS/Shunts: The atrial septum is grossly normal.  LEFT VENTRICLE PLAX 2D LVIDd:         4.30 cm   Diastology LVIDs:         2.80 cm   LV e' medial:    6.42 cm/s LV PW:         1.00 cm   LV E/e' medial:  11.7 LV IVS:        0.80 cm   LV e' lateral:   8.16 cm/s LVOT diam:     1.90 cm   LV E/e' lateral: 9.2 LV SV:         65 LV SV Index:   45 LVOT Area:     2.84 cm  RIGHT VENTRICLE            IVC RV Basal diam:  2.00 cm    IVC diam: 1.10 cm RV Mid diam:    1.70 cm RV S prime:     9.90 cm/s TAPSE (M-mode): 2.4 cm LEFT ATRIUM             Index        RIGHT ATRIUM           Index LA diam:        3.40 cm 2.35 cm/m   RA Area:     14.20 cm LA Vol (A2C):   28.5 ml 19.70 ml/m  RA Volume:   30.20 ml  20.87 ml/m LA Vol (A4C):   23.0 ml 15.90 ml/m LA Biplane Vol: 25.8 ml 17.83 ml/m  AORTIC VALVE                    PULMONIC VALVE AV Area (Vmax):    2.55 cm     PV Vmax:       0.96 m/s AV Area (Vmean):   2.38 cm     PV Peak grad:  3.7 mmHg AV Area (VTI):     2.39 cm AV Vmax:           149.00 cm/s AV Vmean:          98.500 cm/s  AV VTI:            0.272 m AV Peak Grad:  8.9 mmHg AV Mean Grad:      5.0 mmHg LVOT Vmax:         134.00 cm/s LVOT Vmean:        82.600 cm/s LVOT VTI:          0.229 m LVOT/AV VTI ratio: 0.84 AI PHT:            625 msec AR Vena Contracta: 0.20 cm  AORTA Ao Root diam: 2.80 cm Ao Asc diam:  3.40 cm MITRAL VALVE                TRICUSPID VALVE MV Area (PHT): 4.80 cm     TR Peak grad:   25.8 mmHg MV Decel Time: 158 msec     TR Vmax:        254.00 cm/s MV E velocity: 75.10 cm/s MV A velocity: 101.00 cm/s  SHUNTS MV E/A ratio:  0.74         Systemic VTI:  0.23 m                             Systemic Diam: 1.90 cm Lennie Odor MD Electronically signed by Lennie Odor MD Signature Date/Time: 01/30/2023/2:33:43 PM    Final    DG Chest Port 1 View  Result Date: 01/29/2023 CLINICAL DATA:  Syncope EXAM: PORTABLE CHEST 1 VIEW COMPARISON:  Chest radiograph 08/22/2017 FINDINGS: The cardiomediastinal silhouette is normal There is no focal consolidation or pulmonary edema. There is no pleural effusion or pneumothorax There is no acute osseous abnormality. IMPRESSION: No radiographic evidence of acute cardiopulmonary process. Electronically Signed   By: Lesia Hausen M.D.   On: 01/29/2023 11:34   CT Head Wo Contrast  Result Date: 01/29/2023 CLINICAL DATA:  Fall EXAM: CT HEAD WITHOUT CONTRAST TECHNIQUE: Contiguous axial images were obtained from the base of the skull through the vertex without intravenous contrast. RADIATION DOSE REDUCTION: This exam was performed according to the departmental dose-optimization program which includes automated exposure control, adjustment of the mA and/or kV according to patient size and/or use of iterative reconstruction technique. COMPARISON:  None Available. FINDINGS: Brain: No intracranial hemorrhage, mass effect, or evidence of acute infarct. No hydrocephalus. No extra-axial fluid collection. Generalized cerebral atrophy. Ill-defined hypoattenuation within the cerebral white matter is  nonspecific but consistent with chronic small vessel ischemic disease. Vascular: No hyperdense vessel. Intracranial arterial calcification. Skull: No fracture or focal lesion. Sinuses/Orbits: No acute finding. Paranasal sinuses and mastoid air cells are well aerated. Other: None. IMPRESSION: 1. No acute intracranial abnormality. Electronically Signed   By: Minerva Fester M.D.   On: 01/29/2023 03:29   DG Knee Complete 4 Views Right  Result Date: 01/29/2023 CLINICAL DATA:  Status post fall. EXAM: RIGHT KNEE - COMPLETE 4+ VIEW COMPARISON:  None Available. FINDINGS: An acute, nondisplaced fracture deformity is seen extending through the inferior aspect of the right patella. There is no evidence of dislocation. A moderate to large joint effusion is seen. Moderate severity soft tissue swelling is seen anterior to the right patella. IMPRESSION: 1. Acute, nondisplaced fracture of the right patella. 2. Moderate to large joint effusion. Electronically Signed   By: Aram Candela M.D.   On: 01/29/2023 01:17   DG Hip Unilat W or Wo Pelvis 2-3 Views Right  Result Date: 01/29/2023 CLINICAL DATA:  Status post fall. EXAM: DG HIP (WITH OR WITHOUT PELVIS) 2-3V RIGHT COMPARISON:  None Available. FINDINGS: There is no evidence of hip fracture  or dislocation. Degenerative changes seen involving both hips, in the form of joint space narrowing and acetabular sclerosis. IMPRESSION: Degenerative changes without evidence of acute fracture or dislocation. Electronically Signed   By: Aram Candela M.D.   On: 01/29/2023 01:15   DG Shoulder Right  Result Date: 01/29/2023 CLINICAL DATA:  Status post fall. EXAM: RIGHT SHOULDER - 2+ VIEW COMPARISON:  None Available. FINDINGS: There is acute fracture deformity involving the inferior aspect of the right glenoid. There is no evidence of dislocation. Moderate severity degenerative changes are seen involving the right acromioclavicular joint. Soft tissues are unremarkable.  IMPRESSION: Acute fracture of the inferior aspect of the right glenoid. Electronically Signed   By: Aram Candela M.D.   On: 01/29/2023 01:14    Scheduled Meds:  enoxaparin (LOVENOX) injection  40 mg Subcutaneous Q24H   pantoprazole  40 mg Oral QAC breakfast   pravastatin  80 mg Oral Daily   sodium chloride flush  3 mL Intravenous Q12H   Continuous Infusions:  sodium chloride 75 mL/hr at 01/30/23 1515    LOS: 0 days   Marguerita Merles, DO [facility] Available via Epic secure chat 7am-7pm After these hours, please refer to coverage provider listed on [email or web address] 01/30/2023, 4:18 PM

## 2023-01-30 NOTE — Evaluation (Signed)
Physical Therapy Evaluation Patient Details Name: Chelsea Walton MRN: [id number] DOB: [birth year] Today's Date: 01/30/2023  History of Present Illness  Pt is a 82 y.o. F who presents 01/29/2023 with recurrent syncope. Pt initially had an episode 6/24 with a fall and found to have a right patella and right glenoid fracture. Patient was placed in a knee immobilizer and a sling was discharged home after receiving dose of Norco. Then, pt had gotten up to go to the bathroom and became lightheaded/nauseous and had to be assisted to the floor by her husband. Admitted for syncope workup. Significant PMH: HTN, HLD, GIB, RA, spinal stenosis.   Clinical Impression  PTA pt reports independence with functional mobility and ADL's, uses a SPC at home PRN. Today, pt states they have been up and amb to the bathroom with nursing, orthostatics assessed by RN and negative. Vitals collected for this session; Sitting EOB: 146/93, 71 HR, Sitting EOB x 5 minutes: 129/88 (101), 79 HR, Supine: 151/86 (105). Pt with increased pallor, diaphoresis, and concerns of N/V in seated and standing using the SPC. Ambulation deferred at this time. Pt acknowledges and reports their precautions for weight bearing status, NWB through RUE and WBAT through RLE with KI donned, prior to transferring to standing. Smooth pursuits in seated reveal L horizontal beating nystagmus but unable to progress further into vestibular exams due to symptoms listed above. Pt will continue to benefit from skilled PT during their acute admission to facilitate progression of mobility, AD management, and stair training. PT currently rec OP PT pending d/c.     Recommendations for follow up therapy are one component of a multi-disciplinary discharge planning process, led by the attending physician.  Recommendations may be updated based on patient status, additional functional criteria and insurance authorization.  Follow Up Recommendations       Assistance  Recommended at Discharge Intermittent Supervision/Assistance  Patient can return home with the following  A little help with walking and/or transfers;A little help with bathing/dressing/bathroom;Assistance with cooking/housework;Assist for transportation;Help with stairs or ramp for entrance    Equipment Recommendations Other (comment) (pending mobility progression)  Recommendations for Other Services       Functional Status Assessment Patient has had a recent decline in their functional status and demonstrates the ability to make significant improvements in function in a reasonable and predictable amount of time.     Precautions / Restrictions Precautions Precautions: Fall Precaution Comments: Check orthostatics Required Braces or Orthoses: Sling;Knee Immobilizer - Right Knee Immobilizer - Right: On at all times Restrictions Weight Bearing Restrictions: Yes RUE Weight Bearing: Non weight bearing RLE Weight Bearing: Weight bearing as tolerated      Mobility  Bed Mobility Overal bed mobility: Independent             General bed mobility comments: Uses LUE to stabilize into long sitting, swings legs EOB with no assist    Transfers Overall transfer level: Modified independent Equipment used: Straight cane               General transfer comment: Utilizes LUE with SPC to achieve upright standing with adequate BLE push off    Ambulation/Gait                  Stairs            Wheelchair Mobility    Modified Rankin (Stroke Patients Only)       Balance Overall balance assessment: Needs assistance Sitting-balance support: No upper extremity supported, Feet supported  Sitting balance-Leahy Scale: Good Sitting balance - Comments: Can reach down with LUE to adjust socks seated EOB   Standing balance support: Single extremity supported, During functional activity Standing balance-Leahy Scale: Good                                Pertinent Vitals/Pain Pain Assessment Pain Assessment: Faces Faces Pain Scale: Hurts a little bit Pain Location: R knee Pain Intervention(s): Limited activity within patient's tolerance, Monitored during session    Home Living Family/patient expects to be discharged to:: Private residence Living Arrangements: Spouse/significant other Available Help at Discharge: Family Type of Home: House Home Access: Stairs to enter Entrance Stairs-Rails: Left Entrance Stairs-Number of Steps: 4 Alternate Level Stairs-Number of Steps: flight Home Layout: Two level;Able to live on main level with bedroom/bathroom Home Equipment: Cane - single point      Prior Function Prior Level of Function : Independent/Modified Independent             Mobility Comments: Uses SPC PRN       Hand Dominance        Extremity/Trunk Assessment   Upper Extremity Assessment Upper Extremity Assessment: Overall WFL for tasks assessed    Lower Extremity Assessment Lower Extremity Assessment: Overall WFL for tasks assessed    Cervical / Trunk Assessment Cervical / Trunk Assessment: Normal  Communication   Communication: No difficulties  Cognition Arousal/Alertness: Awake/alert Behavior During Therapy: WFL for tasks assessed/performed Overall Cognitive Status: Within Functional Limits for tasks assessed                                          General Comments General comments (skin integrity, edema, etc.): Sitting EOB: 146/93, 71 HR  Sitting EOB x 5 minutes: 129/88 (101), 79 HR  Supine: 151/86 (105). L beating horizontal nystagmus observed with smooth pursuits, unable to continue vestibular examination due to diaphoresis, nausea, and pallor experienced in standing.    Exercises     Assessment/Plan    PT Assessment Patient needs continued PT services  PT Problem List Decreased activity tolerance;Decreased mobility       PT Treatment Interventions DME instruction;Gait  training;Stair training;Functional mobility training;Balance training    PT Goals (Current goals can be found in the Care Plan section)  Acute Rehab PT Goals Patient Stated Goal: get home PT Goal Formulation: With patient Time For Goal Achievement: 02/13/23 Potential to Achieve Goals: Good    Frequency Min 3X/week     Co-evaluation               AM-PAC PT "6 Clicks" Mobility  Outcome Measure Help needed turning from your back to your side while in a flat bed without using bedrails?: None Help needed moving from lying on your back to sitting on the side of a flat bed without using bedrails?: None Help needed moving to and from a bed to a chair (including a wheelchair)?: A Little Help needed standing up from a chair using your arms (e.g., wheelchair or bedside chair)?: A Little Help needed to walk in hospital room?: A Little Help needed climbing 3-5 steps with a railing? : A Lot 6 Click Score: 19    End of Session Equipment Utilized During Treatment: Gait belt;Right knee immobilizer Activity Tolerance: Other (comment) (Treatment limited by symptoms listed in general comments.) Patient left: in  bed;with call bell/phone within reach Nurse Communication: Mobility status;Other (comment) (Nausea, diaphoresis, and pallor with standing EOB) PT Visit Diagnosis: Repeated falls (R29.6)    Time: 1610-9604 PT Time Calculation (min) (ACUTE ONLY): 20 min   Charges:   PT Evaluation $PT Eval Low Complexity: 1 Low          Hendricks Milo, SPT  Acute Rehabilitation Services   Hendricks Milo 01/30/2023, 12:29 PM

## 2023-01-30 NOTE — TOC Initial Note (Signed)
Transition of Care [facility]) - Initial/Assessment Note    Patient Details  Name: Chelsea Walton MRN: [id number] Date of Birth: June 10, 1941  [facility]) CM/SW Contact:    Lawerance Sabal, RN Phone Number: 01/30/2023, 3:25 PM  Clinical Narrative:                  Patient in obs for syncope.  Discussed recs for OP therapy with her at bedside. She states she is active at [facility], as well as her spouse. She will call to schedule OP PT.    Expected Discharge Plan: Home/Self Care Barriers to Discharge: No Barriers Identified   Patient Goals and CMS Choice Patient states their goals for this hospitalization and ongoing recovery are:: to go home CMS Medicare.gov Compare Post Acute Care list provided to:: Patient Choice offered to / list presented to : Patient      Expected Discharge Plan and Services   Discharge Planning Services: CM Consult   Living arrangements for the past 2 months: Single Family Home                 DME Arranged: N/A         HH Arranged: NA          Prior Living Arrangements/Services Living arrangements for the past 2 months: Single Family Home Lives with:: Spouse                   Activities of Daily Living      Permission Sought/Granted                  Emotional Assessment              Admission diagnosis:  Syncope and collapse [R55] Patient Active Problem List   Diagnosis Date Noted   Syncope and collapse 01/29/2023   Rheumatoid arthritis (HCC) 11/01/2021   Spinal stenosis of lumbar region 07/22/2021   Low back pain 05/09/2020   Acute GI bleeding    AVM (arteriovenous malformation) of small bowel, acquired    Benign neoplasm of ascending colon    Benign neoplasm of transverse colon    Melena 01/30/2020   Acute blood loss anemia 01/30/2020   Essential hypertension 01/30/2020   Dyslipidemia 01/30/2020   Symptomatic anemia 01/30/2020   Diarrhea 10/16/2013   Special screening for malignant neoplasms,  colon 10/16/2013   PCP:  Garlan Fillers, MD Pharmacy:   [facility] # [address] [place] - [address] [place] [address] Phone: [phone] Fax: [phone]     Social Determinants of Health (SDOH) Social History: SDOH Screenings   Tobacco Use: Low Risk  (01/29/2023)   SDOH Interventions:     Readmission Risk Interventions     No data to display

## 2023-01-30 NOTE — Care Management Obs Status (Signed)
MEDICARE OBSERVATION STATUS NOTIFICATION   Patient Details  Name: Chelsea Walton MRN: [id number] Date of Birth: 1941-06-12   Medicare Observation Status Notification Given:  Yes    Lawerance Sabal, RN 01/30/2023, 3:10 PM

## 2023-01-31 DIAGNOSIS — I10 Essential (primary) hypertension: Secondary | ICD-10-CM | POA: Diagnosis not present

## 2023-01-31 DIAGNOSIS — R55 Syncope and collapse: Secondary | ICD-10-CM | POA: Diagnosis not present

## 2023-01-31 DIAGNOSIS — E785 Hyperlipidemia, unspecified: Secondary | ICD-10-CM | POA: Diagnosis not present

## 2023-01-31 LAB — COMPREHENSIVE METABOLIC PANEL
ALT: 22 U/L (ref 0–44)
AST: 19 U/L (ref 15–41)
Albumin: 4.2 g/dL (ref 3.5–5.0)
Alkaline Phosphatase: 57 U/L (ref 38–126)
Anion gap: 9 (ref 5–15)
BUN: 6 mg/dL — ABNORMAL LOW (ref 8–23)
CO2: 23 mmol/L (ref 22–32)
Calcium: 8.8 mg/dL — ABNORMAL LOW (ref 8.9–10.3)
Chloride: 108 mmol/L (ref 98–111)
Creatinine, Ser: 0.73 mg/dL (ref 0.44–1.00)
GFR, Estimated: >60 mL/min (ref >60)
Glucose, Bld: 100 mg/dL — ABNORMAL HIGH (ref 70–99)
Potassium: 3.8 mmol/L (ref 3.5–5.1)
Sodium: 140 mmol/L (ref 135–145)
Total Bilirubin: 1.3 mg/dL — ABNORMAL HIGH (ref 0.3–1.2)
Total Protein: 6.6 g/dL (ref 6.5–8.1)

## 2023-01-31 LAB — CBC WITH DIFFERENTIAL/PLATELET
Abs Immature Granulocytes: 0.03 10*3/uL (ref 0.00–0.07)
Basophils Absolute: 0 10*3/uL (ref 0.0–0.1)
Basophils Relative: 0 %
Eosinophils Absolute: 0.2 10*3/uL (ref 0.0–0.5)
Eosinophils Relative: 2 %
HCT: 39.8 % (ref 36.0–46.0)
Hemoglobin: 13.8 g/dL (ref 12.0–15.0)
Immature Granulocytes: 0 %
Lymphocytes Relative: 19 %
Lymphs Abs: 1.5 10*3/uL (ref 0.7–4.0)
MCH: 32.9 pg (ref 26.0–34.0)
MCHC: 34.7 g/dL (ref 30.0–36.0)
MCV: 95 fL (ref 80.0–100.0)
Monocytes Absolute: 0.7 10*3/uL (ref 0.1–1.0)
Monocytes Relative: 9 %
Neutro Abs: 5.6 10*3/uL (ref 1.7–7.7)
Neutrophils Relative %: 70 %
Platelets: 210 10*3/uL (ref 150–400)
RBC: 4.19 MIL/uL (ref 3.87–5.11)
RDW: 13 % (ref 11.5–15.5)
WBC: 8 10*3/uL (ref 4.0–10.5)
nRBC: 0 % (ref 0.0–0.2)

## 2023-01-31 LAB — MAGNESIUM: Magnesium: 2.2 mg/dL (ref 1.7–2.4)

## 2023-01-31 LAB — PHOSPHORUS: Phosphorus: 3.1 mg/dL (ref 2.5–4.6)

## 2023-01-31 MED ORDER — OXYCODONE-ACETAMINOPHEN 5-325 MG PO TABS: 1.0000 | ORAL_TABLET | Freq: Four times a day (QID) | ORAL | 0 refills | Status: AC | PRN

## 2023-01-31 MED ORDER — POLYETHYLENE GLYCOL 3350 17 G PO PACK: 17.0000 g | PACK | Freq: Every day | ORAL | 0 refills | Status: AC | PRN

## 2023-01-31 NOTE — Discharge Summary (Signed)
Physician Discharge Summary   Patient: Chelsea Walton MRN: [id number] DOB: 01/22/1941  Admit date:     01/29/2023  Discharge date: 01/31/2023  Discharge Physician: Marguerita Merles, DO   PCP: Garlan Fillers, MD   Recommendations at discharge:   Follow-up with PCP within 1 to 2 weeks repeat CBC, CMP, mag, Phos within 1 week Follow-up with rheumatology in outpatient setting and resume methotrexate at the rheumatologist discretion Continue cardiac monitoring and obtain 30-day Holter monitor  Discharge Diagnoses: Principal Problem:   Syncope and collapse Active Problems:   Essential hypertension   Dyslipidemia  Resolved Problems:   * No resolved hospital problems. [facility] Course: The patient is an 82 year old Caucasian female with past medical history significant for prolonged to hypertension, hyperlipidemia, history of GI bleeds, history of rheumatoid arthritis, history of spinal stenosis as well as other comorbidities presented after syncope and collapse.  She initially had an episode last night and she got up off the couch to kill a bug and when she bent over she became lightheaded and fell forward and hit her elbow and her knee on the bed floor.  She is [facility] and was found to have a right patella and right glenoid fracture and was placed in a knee immobilizer as well as a sling and was discharged home after she received some Norco.  She had not had a chance to get sleep overnight and had gotten up to go to the bathroom and became lightheaded and nauseous and was dizzy and had to be assisted to the floor by her husband.  Per report she did briefly lose consciousness.  No injury given that she send on the the wall.  Because of this she was brought back to the emergency room and lab work was obtained and troponin was negative.  TSH was normal and CT of the head showed no acute abnormality at that time.  She received 500 mL fluid bolus and was admitted for  syncopal workup.  Echocardiogram was obtained and had a normal EF but grade 1 diastolic dysfunction.  She was doing much better today however she had a recurrent episode with PT and did drop her blood pressure however did not meet parameters for orthostasis but was symptomatic with increased pallor, diaphoresis and concerns of nausea vomiting so ambulation was deferred.  Assessment and Plan:  Syncope and collapse with recurrent near syncope in the setting of orthostasis and vasovagal syncope Right glenoid fracture Right patella fracture -Patient presenting after 2 episodes of near syncope/syncope.  Initially had near syncope and had a fall on hit her right side on brick floor, she was worked up at [employer] [facility] for this, and found to have right patella fracture and right glenoid fracture.  Placed in sling and knee immobilizer. -After returning home patient has had recurrent episode where she became lightheaded and nauseous but was able to be assisted to the ground this time.  Did lose consciousness during second event. -Due to recurrent episodes patient uncomfortable going home and we will proceed with syncope workup and patient.  Prodrome suspicious for vasovagal versus orthostatic. -Continue to Monitor on telemetry overnight -Orthostatic vital signs -Ordered Echocardiogram and showed "Left ventricular ejection fraction, by estimation, is 60 to 65%. The left ventricle has normal function. The left ventricle has no regional wall motion abnormalities. Left ventricular diastolic parameters are  consistent with Grade I diastolic dysfunction (impaired relaxation). " -Troponins were flat and negative x 2 as  it was 3 -PT recommending outpatient physical therapy -Continue with knee immobilizer and sling -Patient became extremely diaphoretic and dizzy when working with therapy so we will give her a 1-1/2 L bolus and started on maintenance IV fluids and ordered TED hose and abdominal  binder -Vestibular PT not done given that she did not have any vestibular symptoms and orthostatics and vital signs done and she did fairly well -Continue monitor and trend will send her home with a 30-day Holter monitor -He is medically stable for discharge and will need to follow-up with PCP  Hypertension -Continue home Amlodipine 2.5 mg po Daily -Continue to Monitor BP per Protocol -Last BP Reading was 137/83   Hyperlipidemia -Continue home Pravastatin 80 mg po Daily  Rheumatoid Arthritis  -Held Methotrexate while hospitalized and can resume as an outpatient    Hypokalemia -Patient's K+ Level Trend: Recent Labs  Lab 01/29/23 1049 01/30/23 0124 01/31/23 0855  K 3.4* 3.7 3.8  -Continue to Monitor and Replete as Necessary -Repeat CMP within a week  GERD/GI Prophylaxis -C/w Pantoprazole 40 mg po Daily   Consultants: None Procedures performed: Echocardiogram Disposition: Home with outpatient physical therapy Diet recommendation:  Discharge Diet Orders (From admission, onward)     Start     Ordered   01/31/23 0000  Diet - low sodium heart healthy        01/31/23 1301           Cardiac diet DISCHARGE MEDICATION: Allergies as of 01/31/2023       Reactions   Meloxicam Other (See Comments)   Intestinal bleeding    Tribenzor [olmesartan-amlodipine-hctz] Nausea And Vomiting, Other (See Comments)   Smell and taste perversion (could not even tolerate "the smell of water")   Ibuprofen Other (See Comments)   Gi bleeding   Metoprolol Nausea And Vomiting, Other (See Comments)   Body aches and chills, also        Medication List     STOP taking these medications    HYDROcodone-acetaminophen 5-325 MG tablet Commonly known as: NORCO/VICODIN       TAKE these medications    acetaminophen 500 MG tablet Commonly known as: TYLENOL Take 1,000 mg by mouth 3 (three) times daily.   amLODipine 2.5 MG tablet Commonly known as: NORVASC Take 1 tablet (2.5 mg total) by  mouth daily. What changed: when to take this   ferrous sulfate 325 (65 FE) MG EC tablet Take 1 tablet (325 mg total) by mouth 2 (two) times daily with a meal.   folic acid 1 MG tablet Commonly known as: FOLVITE Take 1 mg by mouth in the morning and at bedtime.   gabapentin 300 MG capsule Commonly known as: NEURONTIN Take 300 mg by mouth daily as needed (for hip pain).   ibandronate 150 MG tablet Commonly known as: BONIVA Take 150 mg by mouth every 30 (thirty) days. Take in the morning with a full glass of water, on an empty stomach, and do not take anything else by mouth or lie down for the next 30 min.   losartan 100 MG tablet Commonly known as: COZAAR Take 100 mg by mouth daily.   methotrexate 2.5 MG tablet Commonly known as: RHEUMATREX Take 20 mg by mouth once a week. Caution:Chemotherapy. Protect from light.   oxyCODONE-acetaminophen 5-325 MG tablet Commonly known as: PERCOCET/ROXICET Take 1 tablet by mouth every 6 (six) hours as needed for up to 5 days for severe pain or moderate pain.   pantoprazole 40 MG tablet  Commonly known as: PROTONIX Take 1 tablet (40 mg total) by mouth daily before breakfast.   polyethylene glycol 17 g packet Commonly known as: MIRALAX / GLYCOLAX Take 17 g by mouth daily as needed for mild constipation.   pravastatin 80 MG tablet Commonly known as: PRAVACHOL Take 80 mg by mouth daily.   vitamin C 250 MG tablet Commonly known as: ASCORBIC ACID Take 1 tablet (250 mg total) by mouth daily.   zolpidem 10 MG tablet Commonly known as: AMBIEN Take 5 mg by mouth at bedtime as needed for sleep.       Discharge Exam: Filed Weights   01/29/23 1030  Weight: 51.3 kg   Vitals:   01/31/23 0648 01/31/23 0739  BP: 121/86 137/83  Pulse: 87 76  Resp: 18 18  Temp:  97.7 F (36.5 C)  SpO2: 97% 95%   Examination: Physical Exam:  Constitutional: Thin Caucasian female in no acute distress appears calm and has her arm in a sling and knee in a  brace Respiratory: Diminished to auscultation bilaterally, no wheezing, rales, rhonchi or crackles. Normal respiratory effort and patient is not tachypenic. No accessory muscle use.  Unlabored breathing Cardiovascular: RRR, no murmurs / rubs / gallops. S1 and S2 auscultated. No extremity edema.  Abdomen: Soft, non-tender, non-distended. Bowel sounds positive.  GU: Deferred. Musculoskeletal: No clubbing / cyanosis of digits/nails.  Remains in a sling and knee is in a brace Skin: No rashes, lesions, ulcers. No induration; Warm and dry.  Neurologic: CN 2-12 grossly intact with no focal deficits. Romberg sign and cerebellar reflexes not assessed.  Psychiatric: Normal judgment and insight. Alert and oriented x 3. Normal mood and appropriate affect.   Condition at discharge: stable  The results of significant diagnostics from this hospitalization (including imaging, microbiology, ancillary and laboratory) are listed below for reference.   Imaging Studies: ECHOCARDIOGRAM COMPLETE  Result Date: 01/30/2023    ECHOCARDIOGRAM REPORT   Patient Name:   Chelsea Walton Date of Exam: 01/30/2023 Medical Rec #:  [id number]         Height:       59.0 in Accession #:    [id number]        Weight:       113.0 lb Date of Birth:  Dec 04, 1940          BSA:          1.447 m Patient Age:    81 years          BP:           137/86 mmHg Patient Gender: F                 HR:           77 bpm. Exam Location:  Inpatient Procedure: 2D Echo, Cardiac Doppler and Color Doppler Indications:    Syncope R55  History:        Patient has no prior history of Echocardiogram examinations.                 Signs/Symptoms:Syncope; Risk Factors:Hypertension, Dyslipidemia                 and Non-Smoker.  Sonographer:    Dondra Prader RVT RCS Referring Phys: Cecille Po MELVIN IMPRESSIONS  1. Left ventricular ejection fraction, by estimation, is 60 to 65%. The left ventricle has normal function. The left ventricle has no regional wall motion  abnormalities. Left ventricular diastolic parameters are consistent with Grade I diastolic  dysfunction (impaired relaxation).  2. Right ventricular systolic function is normal. The right ventricular size is normal. There is normal pulmonary artery systolic pressure. The estimated right ventricular systolic pressure is 28.8 mmHg.  3. The mitral valve is grossly normal. Mild mitral valve regurgitation. No evidence of mitral stenosis. There is mild late systolic prolapse of the middle scallop of the posterior leaflet of the mitral valve.  4. The aortic valve is tricuspid. Aortic valve regurgitation is mild. No aortic stenosis is present.  5. The inferior vena cava is normal in size with greater than 50% respiratory variability, suggesting right atrial pressure of 3 mmHg. FINDINGS  Left Ventricle: Left ventricular ejection fraction, by estimation, is 60 to 65%. The left ventricle has normal function. The left ventricle has no regional wall motion abnormalities. The left ventricular internal cavity size was normal in size. There is  no left ventricular hypertrophy. Left ventricular diastolic parameters are consistent with Grade I diastolic dysfunction (impaired relaxation). Right Ventricle: The right ventricular size is normal. No increase in right ventricular wall thickness. Right ventricular systolic function is normal. There is normal pulmonary artery systolic pressure. The tricuspid regurgitant velocity is 2.54 m/s, and  with an assumed right atrial pressure of 3 mmHg, the estimated right ventricular systolic pressure is 28.8 mmHg. Left Atrium: Left atrial size was normal in size. Right Atrium: Right atrial size was normal in size. Pericardium: There is no evidence of pericardial effusion. Mitral Valve: The mitral valve is grossly normal. There is mild late systolic prolapse of the middle scallop of the posterior leaflet of the mitral valve. Mild mitral valve regurgitation. No evidence of mitral valve stenosis.  Tricuspid Valve: The tricuspid valve is grossly normal. Tricuspid valve regurgitation is mild . No evidence of tricuspid stenosis. Aortic Valve: The aortic valve is tricuspid. Aortic valve regurgitation is mild. Aortic regurgitation PHT measures 625 msec. No aortic stenosis is present. Aortic valve mean gradient measures 5.0 mmHg. Aortic valve peak gradient measures 8.9 mmHg. Aortic  valve area, by VTI measures 2.39 cm. Pulmonic Valve: The pulmonic valve was grossly normal. Pulmonic valve regurgitation is not visualized. No evidence of pulmonic stenosis. Aorta: The aortic root and ascending aorta are structurally normal, with no evidence of dilitation. Venous: The inferior vena cava is normal in size with greater than 50% respiratory variability, suggesting right atrial pressure of 3 mmHg. IAS/Shunts: The atrial septum is grossly normal.  LEFT VENTRICLE PLAX 2D LVIDd:         4.30 cm   Diastology LVIDs:         2.80 cm   LV e' medial:    6.42 cm/s LV PW:         1.00 cm   LV E/e' medial:  11.7 LV IVS:        0.80 cm   LV e' lateral:   8.16 cm/s LVOT diam:     1.90 cm   LV E/e' lateral: 9.2 LV SV:         65 LV SV Index:   45 LVOT Area:     2.84 cm  RIGHT VENTRICLE            IVC RV Basal diam:  2.00 cm    IVC diam: 1.10 cm RV Mid diam:    1.70 cm RV S prime:     9.90 cm/s TAPSE (M-mode): 2.4 cm LEFT ATRIUM             Index  RIGHT ATRIUM           Index LA diam:        3.40 cm 2.35 cm/m   RA Area:     14.20 cm LA Vol (A2C):   28.5 ml 19.70 ml/m  RA Volume:   30.20 ml  20.87 ml/m LA Vol (A4C):   23.0 ml 15.90 ml/m LA Biplane Vol: 25.8 ml 17.83 ml/m  AORTIC VALVE                    PULMONIC VALVE AV Area (Vmax):    2.55 cm     PV Vmax:       0.96 m/s AV Area (Vmean):   2.38 cm     PV Peak grad:  3.7 mmHg AV Area (VTI):     2.39 cm AV Vmax:           149.00 cm/s AV Vmean:          98.500 cm/s AV VTI:            0.272 m AV Peak Grad:      8.9 mmHg AV Mean Grad:      5.0 mmHg LVOT Vmax:         134.00  cm/s LVOT Vmean:        82.600 cm/s LVOT VTI:          0.229 m LVOT/AV VTI ratio: 0.84 AI PHT:            625 msec AR Vena Contracta: 0.20 cm  AORTA Ao Root diam: 2.80 cm Ao Asc diam:  3.40 cm MITRAL VALVE                TRICUSPID VALVE MV Area (PHT): 4.80 cm     TR Peak grad:   25.8 mmHg MV Decel Time: 158 msec     TR Vmax:        254.00 cm/s MV E velocity: 75.10 cm/s MV A velocity: 101.00 cm/s  SHUNTS MV E/A ratio:  0.74         Systemic VTI:  0.23 m                             Systemic Diam: 1.90 cm Lennie Odor MD Electronically signed by Lennie Odor MD Signature Date/Time: 01/30/2023/2:33:43 PM    Final    DG Chest Port 1 View  Result Date: 01/29/2023 CLINICAL DATA:  Syncope EXAM: PORTABLE CHEST 1 VIEW COMPARISON:  Chest radiograph 08/22/2017 FINDINGS: The cardiomediastinal silhouette is normal There is no focal consolidation or pulmonary edema. There is no pleural effusion or pneumothorax There is no acute osseous abnormality. IMPRESSION: No radiographic evidence of acute cardiopulmonary process. Electronically Signed   By: Lesia Hausen M.D.   On: 01/29/2023 11:34   CT Head Wo Contrast  Result Date: 01/29/2023 CLINICAL DATA:  Fall EXAM: CT HEAD WITHOUT CONTRAST TECHNIQUE: Contiguous axial images were obtained from the base of the skull through the vertex without intravenous contrast. RADIATION DOSE REDUCTION: This exam was performed according to the departmental dose-optimization program which includes automated exposure control, adjustment of the mA and/or kV according to patient size and/or use of iterative reconstruction technique. COMPARISON:  None Available. FINDINGS: Brain: No intracranial hemorrhage, mass effect, or evidence of acute infarct. No hydrocephalus. No extra-axial fluid collection. Generalized cerebral atrophy. Ill-defined hypoattenuation within the cerebral white matter is nonspecific but consistent with chronic small vessel ischemic  disease. Vascular: No hyperdense vessel.  Intracranial arterial calcification. Skull: No fracture or focal lesion. Sinuses/Orbits: No acute finding. Paranasal sinuses and mastoid air cells are well aerated. Other: None. IMPRESSION: 1. No acute intracranial abnormality. Electronically Signed   By: Minerva Fester M.D.   On: 01/29/2023 03:29   DG Knee Complete 4 Views Right  Result Date: 01/29/2023 CLINICAL DATA:  Status post fall. EXAM: RIGHT KNEE - COMPLETE 4+ VIEW COMPARISON:  None Available. FINDINGS: An acute, nondisplaced fracture deformity is seen extending through the inferior aspect of the right patella. There is no evidence of dislocation. A moderate to large joint effusion is seen. Moderate severity soft tissue swelling is seen anterior to the right patella. IMPRESSION: 1. Acute, nondisplaced fracture of the right patella. 2. Moderate to large joint effusion. Electronically Signed   By: Aram Candela M.D.   On: 01/29/2023 01:17   DG Hip Unilat W or Wo Pelvis 2-3 Views Right  Result Date: 01/29/2023 CLINICAL DATA:  Status post fall. EXAM: DG HIP (WITH OR WITHOUT PELVIS) 2-3V RIGHT COMPARISON:  None Available. FINDINGS: There is no evidence of hip fracture or dislocation. Degenerative changes seen involving both hips, in the form of joint space narrowing and acetabular sclerosis. IMPRESSION: Degenerative changes without evidence of acute fracture or dislocation. Electronically Signed   By: Aram Candela M.D.   On: 01/29/2023 01:15   DG Shoulder Right  Result Date: 01/29/2023 CLINICAL DATA:  Status post fall. EXAM: RIGHT SHOULDER - 2+ VIEW COMPARISON:  None Available. FINDINGS: There is acute fracture deformity involving the inferior aspect of the right glenoid. There is no evidence of dislocation. Moderate severity degenerative changes are seen involving the right acromioclavicular joint. Soft tissues are unremarkable. IMPRESSION: Acute fracture of the inferior aspect of the right glenoid. Electronically Signed   By: Aram Candela M.D.   On: 01/29/2023 01:14    Microbiology: Results for orders placed or performed during the hospital encounter of 01/30/20  SARS Coronavirus 2 by RT PCR (hospital order, performed in [facility] hospital lab) Nasopharyngeal Nasopharyngeal Swab     Status: None   Collection Time: 01/30/20  4:15 PM   Specimen: Nasopharyngeal Swab  Result Value Ref Range Status   SARS Coronavirus 2 NEGATIVE NEGATIVE Final    Comment: (NOTE) SARS-CoV-2 target nucleic acids are NOT DETECTED.  The SARS-CoV-2 RNA is generally detectable in upper and lower respiratory specimens during the acute phase of infection. The lowest concentration of SARS-CoV-2 viral copies this assay can detect is 250 copies / mL. A negative result does not preclude SARS-CoV-2 infection and should not be used as the sole basis for treatment or other patient management decisions.  A negative result may occur with improper specimen collection / handling, submission of specimen other than nasopharyngeal swab, presence of viral mutation(s) within the areas targeted by this assay, and inadequate number of viral copies (<250 copies / mL). A negative result must be combined with clinical observations, patient history, and epidemiological information.  Fact Sheet for Patients:   BoilerBrush.com.cy  Fact Sheet for Healthcare Providers: https://pope.com/  This test is not yet approved or  cleared by the [place] FDA and has been authorized for detection and/or diagnosis of SARS-CoV-2 by FDA under an Emergency Use Authorization (EUA).  This EUA will remain in effect (meaning this test can be used) for the duration of the COVID-19 declaration under Section 564(b)(1) of the Act, 21 U.S.C. section 360bbb-3(b)(1), unless the authorization is terminated or revoked sooner.  Performed at [facility], [address] [place], [place] [address]    Labs: CBC: Recent Labs   Lab 01/29/23 1049 01/30/23 0124 01/31/23 0855  WBC 9.3 8.2 8.0  NEUTROABS 7.3  --  5.6  HGB 14.2 13.4 13.8  HCT 40.2 38.3 39.8  MCV 93.9 95.0 95.0  PLT 206 206 210   Basic Metabolic Panel: Recent Labs  Lab 01/29/23 1049 01/30/23 0124 01/31/23 0855  NA 139 137 140  K 3.4* 3.7 3.8  CL 104 106 108  CO2 25 23 23   GLUCOSE 112* 108* 100*  BUN 11 9 6*  CREATININE 0.83 0.85 0.73  CALCIUM 8.9 8.7* 8.8*  MG 2.2 2.2 2.2  PHOS  --   --  3.1   Liver Function Tests: Recent Labs  Lab 01/30/23 0124 01/31/23 0855  AST 17 19  ALT 22 22  ALKPHOS 52 57  BILITOT 1.2 1.3*  PROT 5.9* 6.6  ALBUMIN 3.8 4.2   CBG: Recent Labs  Lab 01/29/23 1253  GLUCAP 101*   Discharge time spent: greater than 30 minutes.  Signed: Marguerita Merles, DO [facility] 02/01/2023

## 2023-01-31 NOTE — Evaluation (Addendum)
Occupational Therapy Evaluation Patient Details Name: Chelsea Walton MRN: [id number] DOB: 1941/06/18 Today's Date: 01/31/2023   History of Present Illness Pt is a 82 y.o. F who presents 01/29/2023 with recurrent syncope. Pt initially had an episode 6/24 with a fall and found to have a right patella and right glenoid fracture. Patient was placed in a knee immobilizer and a sling was discharged home after receiving dose of Norco. Then, pt had gotten up to go to the bathroom and became lightheaded/nauseous and had to be assisted to the floor by her husband. Admitted for syncope workup. Significant PMH: HTN, HLD, GIB, RA, spinal stenosis.   Clinical Impression   Pt seen for above diagnosis. Pt doing well, motivated to improve, good activity tolerance. Pt PLOF independent, lives with husband in 2 story house, able to live on first floor, 4 steps to enter home with B hand rails. Pt currently has RUE sling, NWB status, RLE WBAT with knee immobilizer, requires assistance with ADLs, husband is able to assist as needed. Pt is supervision for ambulation/transfers for safety due to recent episodes of syncope, today had no LOB during functional activities. Pt overall in good health, will be safe at home with husband who can help 24/7, Pt educated on precautions for RUE and sling wear and precautions for ambulation to prevent further falls. Pt does not need further acute OT, would benefit from OPOT when able to participate, follow MD recommendations for follow up therapies.      Recommendations for follow up therapy are one component of a multi-disciplinary discharge planning process, led by the attending physician.  Recommendations may be updated based on patient status, additional functional criteria and insurance authorization.   Assistance Recommended at Discharge Intermittent Supervision/Assistance  Patient can return home with the following A little help with walking and/or transfers;A little help with  bathing/dressing/bathroom;Assistance with cooking/housework;Assist for transportation;Help with stairs or ramp for entrance    Functional Status Assessment  Patient has had a recent decline in their functional status and demonstrates the ability to make significant improvements in function in a reasonable and predictable amount of time.  Equipment Recommendations  None recommended by OT    Recommendations for Other Services       Precautions / Restrictions Precautions Precautions: Fall Precaution Comments: Check orthostatics Required Braces or Orthoses: Sling;Knee Immobilizer - Right Knee Immobilizer - Right: On at all times Restrictions Weight Bearing Restrictions: Yes RUE Weight Bearing: Non weight bearing RLE Weight Bearing: Weight bearing as tolerated      Mobility Bed Mobility Overal bed mobility: Modified Independent             General bed mobility comments: increased time    Transfers Overall transfer level: Needs assistance Equipment used: Straight cane Transfers: Sit to/from Stand, Bed to chair/wheelchair/BSC Sit to Stand: Supervision     Step pivot transfers: Supervision     General transfer comment: SPV for safety due to recent episodes of syncope with standing      Balance Overall balance assessment: Needs assistance Sitting-balance support: No upper extremity supported, Feet supported Sitting balance-Leahy Scale: Good Sitting balance - Comments: Can reach down with LUE to adjust socks seated EOB   Standing balance support: Single extremity supported, During functional activity Standing balance-Leahy Scale: Good Standing balance comment: able to stand at sink and perform UB ADLs with LUE, unsupported  ADL either performed or assessed with clinical judgement   ADL Overall ADL's : Needs assistance/impaired Eating/Feeding: Set up   Grooming: Set up;Standing   Upper Body Bathing: Moderate assistance    Lower Body Bathing: Moderate assistance   Upper Body Dressing : Minimal assistance;Sitting   Lower Body Dressing: Moderate assistance;Sit to/from stand   Toilet Transfer: Supervision/safety;Ambulation           Functional mobility during ADLs: Supervision/safety General ADL Comments: Pt able to ambulate using SPC, supervision, has had syncope 3X in the last week, R knee brace, RUE sling, requires assistance for all ADLs     Vision Baseline Vision/History: 1 Wears glasses Ability to See in Adequate Light: 0 Adequate Patient Visual Report: No change from baseline       Perception     Praxis      Pertinent Vitals/Pain Pain Assessment Pain Assessment: 0-10 Pain Score: 3  Faces Pain Scale: Hurts a little bit Pain Location: R knee Pain Descriptors / Indicators: Aching Pain Intervention(s): Monitored during session     Hand Dominance Right   Extremity/Trunk Assessment Upper Extremity Assessment Upper Extremity Assessment: RUE deficits/detail RUE Deficits / Details: R glenoid fx, has sling, NWB RUE: Unable to fully assess due to immobilization RUE Sensation: WNL RUE Coordination: decreased gross motor   Lower Extremity Assessment Lower Extremity Assessment: Defer to PT evaluation       Communication Communication Communication: No difficulties   Cognition Arousal/Alertness: Awake/alert Behavior During Therapy: WFL for tasks assessed/performed Overall Cognitive Status: Within Functional Limits for tasks assessed                                 General Comments: very conversational, plesant     General Comments       Exercises     Shoulder Instructions      Home Living Family/patient expects to be discharged to:: Private residence Living Arrangements: Spouse/significant other Available Help at Discharge: Family Type of Home: House Home Access: Stairs to enter Secretary/administrator of Steps: 4 Entrance Stairs-Rails: Left Home Layout:  Two level;Able to live on main level with bedroom/bathroom Alternate Level Stairs-Number of Steps: flight Alternate Level Stairs-Rails: Can reach both Bathroom Shower/Tub: Producer, television/film/video: Standard     Home Equipment: Cane - single point   Additional Comments: Pt lives at home with husband, planning on getting shower chair      Prior Functioning/Environment Prior Level of Function : Independent/Modified Independent             Mobility Comments: Uses SPC PRN          OT Problem List: Decreased range of motion;Impaired balance (sitting and/or standing);Impaired UE functional use;Pain      OT Treatment/Interventions:      OT Goals(Current goals can be found in the care plan section) Acute Rehab OT Goals Patient Stated Goal: to return home OT Goal Formulation: With patient Time For Goal Achievement: 02/14/23 Potential to Achieve Goals: Good  OT Frequency:      Co-evaluation              AM-PAC OT "6 Clicks" Daily Activity     Outcome Measure Help from another person eating meals?: A Little Help from another person taking care of personal grooming?: A Little Help from another person toileting, which includes using toliet, bedpan, or urinal?: A Little Help from another person bathing (including washing, rinsing, drying)?:  A Lot Help from another person to put on and taking off regular upper body clothing?: A Little Help from another person to put on and taking off regular lower body clothing?: A Lot 6 Click Score: 16   End of Session CPM Right Knee CPM Right Knee: On Nurse Communication: Mobility status  Activity Tolerance: Patient tolerated treatment well Patient left: in bed;with call bell/phone within reach  OT Visit Diagnosis: Repeated falls (R29.6);Pain Pain - Right/Left: Right Pain - part of body: Knee;Shoulder                Time: 7846-9629 OT Time Calculation (min): 32 min Charges:  OT General Charges $OT Visit: 1 Visit OT  Evaluation $OT Eval Low Complexity: 1 Low OT Treatments $Self Care/Home Management : 8-22 mins  [place], OTR/L   Chelsea Walton 01/31/2023, 11:44 AM

## 2023-01-31 NOTE — Plan of Care (Signed)
  Problem: Education: Goal: Knowledge of condition and prescribed therapy will improve Outcome: Progressing   Problem: Health Behavior/Discharge Planning: Goal: Ability to manage health-related needs will improve Outcome: Progressing   Problem: Activity: Goal: Risk for activity intolerance will decrease Outcome: Progressing   Problem: Elimination: Goal: Will not experience complications related to bowel motility Outcome: Progressing Goal: Will not experience complications related to urinary retention Outcome: Progressing   Problem: Pain Managment: Goal: General experience of comfort will improve Outcome: Progressing   Problem: Safety: Goal: Ability to remain free from injury will improve Outcome: Progressing

## 2023-01-31 NOTE — Progress Notes (Signed)
Physical Therapy Treatment Patient Details Name: Chelsea Walton MRN: [id number] DOB: [birth year] Today's Date: 01/31/2023   History of Present Illness Pt is a 82 y.o. F who presents 01/29/2023 with recurrent syncope. Pt initially had an episode 6/24 with a fall and found to have a right patella and right glenoid fracture. Patient was placed in a knee immobilizer and a sling was discharged home after receiving dose of Norco. Then, pt had gotten up to go to the bathroom and became lightheaded/nauseous and had to be assisted to the floor by her husband. Admitted for syncope workup. Significant PMH: HTN, HLD, GIB, RA, spinal stenosis.    PT Comments    Vestibular screen completed and tolerated without issues. Thorough history taken.  Denies any symptoms since yesterday when working with PT while sitting on edge of bed for approximately 5 minutes, thought to have Lt lateral gaze nystagmus. Today, no focal deficits noted, balance WFL (with KI in place for patellar fx,) denies vertigo or similar symptoms with positional changes. Reports 2 falls at home, both occurred while standing, one with a brief period of LOC, assisted to floor by husband. Prodromal symptoms of diaphoresis, weakness, and feeling faint; no true vertigo. Denies prior hx of BPPV or spontaneous vertigo. No report of recent visual or hearing changes. No motion sensitivity, symptoms not worsened by rolling or head position. Able to ambulate with SPC short distances safely today. Safely completed stair training at min guard level. Extensive education for symptom awareness and safety to reduce fall risk. Pt reports good support at home from husband. Will still benefit from OPPT for orthopedic issues. Do not suspect peripheral vestibular issue at this time, however if acute symptoms return additional testing may be more revealing.   Recommendations for follow up therapy are one component of a multi-disciplinary discharge planning process, led by the  attending physician.  Recommendations may be updated based on patient status, additional functional criteria and insurance authorization.     Assistance Recommended at Discharge Intermittent Supervision/Assistance  Patient can return home with the following A little help with walking and/or transfers;A little help with bathing/dressing/bathroom;Assistance with cooking/housework;Assist for transportation;Help with stairs or ramp for entrance   Equipment Recommendations  None recommended by PT       Precautions / Restrictions Precautions Precautions: Fall Precaution Comments: Check orthostatics Required Braces or Orthoses: Sling;Knee Immobilizer - Right Knee Immobilizer - Right: On at all times Restrictions Weight Bearing Restrictions: Yes RUE Weight Bearing: Non weight bearing RLE Weight Bearing: Weight bearing as tolerated     Mobility  Bed Mobility Overal bed mobility: Modified Independent             General bed mobility comments: increased time    Transfers Overall transfer level: Needs assistance Equipment used: Straight cane Transfers: Sit to/from Stand, Bed to chair/wheelchair/BSC Sit to Stand: Supervision   Step pivot transfers: Supervision       General transfer comment: Supervision for safety with SPC, no physical assist needed. Cues for technique.    Ambulation/Gait Ambulation/Gait assistance: Supervision Gait Distance (Feet): 20 Feet Assistive device: Straight cane Gait Pattern/deviations: Step-to pattern, Antalgic, Decreased stance time - right, Decreased weight shift to right Gait velocity: decr Gait velocity interpretation: <1.31 ft/sec, indicative of household ambulator   General Gait Details: Educated on safe AD use with SPC, adjusted to lowest height. Antalgic without evidence of buckling with knee immobilizer in place. Tolerated well.   Stairs Stairs: Yes Stairs assistance: Min guard Stair Management: One rail Left,  Step to pattern,  Forwards, With cane Number of Stairs: 3 General stair comments: Educated on stair navigation techniques using single rail similar to home set-up. Min guard for safety, Cues for sequencing, able to teach back with good recall. Pt will have husband guard at home. Feels confident after practice.   Wheelchair Mobility    Modified Rankin (Stroke Patients Only)       Balance Overall balance assessment: Needs assistance Sitting-balance support: No upper extremity supported, Feet supported Sitting balance-Leahy Scale: Good     Standing balance support: During functional activity, No upper extremity supported Standing balance-Leahy Scale: Good Standing balance comment: Improved stability with SPC                            Cognition Arousal/Alertness: Awake/alert Behavior During Therapy: WFL for tasks assessed/performed Overall Cognitive Status: Within Functional Limits for tasks assessed                                 General Comments: very conversational, plesant        Exercises      General Comments General comments (skin integrity, edema, etc.): No further acute symptoms. Last orthostatic taken looks good. Pt denies any dizziness. Denies vertigo with positional changes.      Pertinent Vitals/Pain Pain Assessment Pain Assessment: Faces Faces Pain Scale: Hurts a little bit Pain Location: R knee Pain Descriptors / Indicators: Aching Pain Intervention(s): Monitored during session, Repositioned, Ice applied    Home Living Family/patient expects to be discharged to:: Private residence Living Arrangements: Spouse/significant other Available Help at Discharge: Family Type of Home: House Home Access: Stairs to enter Entrance Stairs-Rails: Left Entrance Stairs-Number of Steps: 4 Alternate Level Stairs-Number of Steps: flight Home Layout: Two level;Able to live on main level with bedroom/bathroom Home Equipment: Gilmer Mor - single point Additional  Comments: Pt lives at home with husband, planning on getting shower chair    Prior Function            PT Goals (current goals can now be found in the care plan section) Acute Rehab PT Goals Patient Stated Goal: get home PT Goal Formulation: With patient Time For Goal Achievement: 02/13/23 Potential to Achieve Goals: Good Progress towards PT goals: Progressing toward goals    Frequency    Min 3X/week      PT Plan Current plan remains appropriate    Co-evaluation              AM-PAC PT "6 Clicks" Mobility   Outcome Measure  Help needed turning from your back to your side while in a flat bed without using bedrails?: None Help needed moving from lying on your back to sitting on the side of a flat bed without using bedrails?: None Help needed moving to and from a bed to a chair (including a wheelchair)?: A Little Help needed standing up from a chair using your arms (e.g., wheelchair or bedside chair)?: A Little Help needed to walk in hospital room?: A Little Help needed climbing 3-5 steps with a railing? : A Little 6 Click Score: 20    End of Session Equipment Utilized During Treatment: Gait belt;Right knee immobilizer Activity Tolerance: Patient tolerated treatment well Patient left: in bed;with call bell/phone within reach;with nursing/sitter in room Nurse Communication: Mobility status PT Visit Diagnosis: Repeated falls (R29.6);Pain;Difficulty in walking, not elsewhere classified (R26.2) Pain - Right/Left: Right  Pain - part of body: Knee     Time: 1207-1240 PT Time Calculation (min) (ACUTE ONLY): 33 min  Charges:  $Gait Training: 8-22 mins $Therapeutic Activity: 8-22 mins                     Kathlyn [place], PT, DPT [facility]  Rehabilitation Services Physical Therapist Office: [phone] Website: [email or web address]    Berton Mount 01/31/2023, 1:46 PM

## 2023-02-01 ENCOUNTER — Other Ambulatory Visit: Payer: Self-pay | Admitting: Home Health

## 2023-02-01 DIAGNOSIS — R55 Syncope and collapse: Secondary | ICD-10-CM

## 2023-02-01 NOTE — Progress Notes (Signed)
30 days event monitor ordered for syncope, Dr Jacques Navy to see

## 2023-02-04 ENCOUNTER — Telehealth: Payer: Self-pay | Admitting: Internal Medicine

## 2023-02-04 NOTE — Telephone Encounter (Signed)
Patient states she received heart monitor but was not aware that she needed to wear one.. She states right now her arm and leg is immobilized and she will not be able to do monitor right now. She would like to hold off until he appointment with yo on 8/5

## 2023-02-04 NOTE — Telephone Encounter (Signed)
Patient will send monitor back.  Can you cancel order for patient

## 2023-02-04 NOTE — Telephone Encounter (Signed)
Patient calling about the heart monitor she received in the mail. Calling with questions because of the conditions she is. She had a fall. Please advise

## 2023-02-05 NOTE — Addendum Note (Signed)
Addended by: Bernita Buffy on: 02/05/2023 02:19 PM   Modules accepted: Orders

## 2023-02-05 NOTE — Telephone Encounter (Signed)
Monitor order cancelled.

## 2023-02-12 DIAGNOSIS — S82001A Unspecified fracture of right patella, initial encounter for closed fracture: Secondary | ICD-10-CM | POA: Diagnosis not present

## 2023-02-12 DIAGNOSIS — S42144A Nondisplaced fracture of glenoid cavity of scapula, right shoulder, initial encounter for closed fracture: Secondary | ICD-10-CM | POA: Diagnosis not present

## 2023-03-01 DIAGNOSIS — M25661 Stiffness of right knee, not elsewhere classified: Secondary | ICD-10-CM | POA: Diagnosis not present

## 2023-03-07 DIAGNOSIS — M25661 Stiffness of right knee, not elsewhere classified: Secondary | ICD-10-CM | POA: Diagnosis not present

## 2023-03-11 ENCOUNTER — Ambulatory Visit: Payer: Medicare HMO | Admitting: Internal Medicine

## 2023-03-12 DIAGNOSIS — S82001A Unspecified fracture of right patella, initial encounter for closed fracture: Secondary | ICD-10-CM | POA: Diagnosis not present

## 2023-03-12 DIAGNOSIS — S42144A Nondisplaced fracture of glenoid cavity of scapula, right shoulder, initial encounter for closed fracture: Secondary | ICD-10-CM | POA: Diagnosis not present

## 2023-03-22 DIAGNOSIS — M25661 Stiffness of right knee, not elsewhere classified: Secondary | ICD-10-CM | POA: Diagnosis not present

## 2023-03-22 DIAGNOSIS — M25611 Stiffness of right shoulder, not elsewhere classified: Secondary | ICD-10-CM | POA: Diagnosis not present

## 2023-03-29 DIAGNOSIS — M25661 Stiffness of right knee, not elsewhere classified: Secondary | ICD-10-CM | POA: Diagnosis not present

## 2023-03-29 DIAGNOSIS — M25611 Stiffness of right shoulder, not elsewhere classified: Secondary | ICD-10-CM | POA: Diagnosis not present

## 2023-04-03 DIAGNOSIS — M25611 Stiffness of right shoulder, not elsewhere classified: Secondary | ICD-10-CM | POA: Diagnosis not present

## 2023-04-03 DIAGNOSIS — M25661 Stiffness of right knee, not elsewhere classified: Secondary | ICD-10-CM | POA: Diagnosis not present

## 2023-04-10 DIAGNOSIS — M25661 Stiffness of right knee, not elsewhere classified: Secondary | ICD-10-CM | POA: Diagnosis not present

## 2023-04-10 DIAGNOSIS — M25611 Stiffness of right shoulder, not elsewhere classified: Secondary | ICD-10-CM | POA: Diagnosis not present

## 2023-04-11 ENCOUNTER — Encounter: Payer: Self-pay | Admitting: Internal Medicine

## 2023-04-11 ENCOUNTER — Ambulatory Visit: Payer: Medicare HMO | Attending: Internal Medicine | Admitting: Internal Medicine

## 2023-04-11 VITALS — BP 125/81 | HR 73 | Ht 59.0 in | Wt 114.0 lb

## 2023-04-11 DIAGNOSIS — Z136 Encounter for screening for cardiovascular disorders: Secondary | ICD-10-CM | POA: Diagnosis not present

## 2023-04-11 DIAGNOSIS — R9431 Abnormal electrocardiogram [ECG] [EKG]: Secondary | ICD-10-CM | POA: Diagnosis not present

## 2023-04-11 NOTE — Progress Notes (Signed)
Cardiology Office Note:  .   Date:  04/11/2023  ID:  Chelsea Walton, DOB 20-Oct-1940, MRN [id number] PCP: Garlan Fillers, MD  [facility] Providers Cardiologist:  None    History of Present Illness: .   Chelsea Walton is a 82 y.o. female hypertension, hyperlipidemia, history of GI bleeds, RA referral for syncope. She was admitted to the hospital. Per DC summary " She initially had an episode last night and she got up off the couch to kill a bug and when she bent over she became lightheaded and fell forward and hit her elbow and her knee on the bed floor." Syncope was thought to be related to orthostatics. She was recommended to have a 30 day monitor but did not wear it.  Otherwise, her echo was normal   She reports three episodes of fainting, the first of which resulted in a broken knee and shoulder. The second episode was characterized by nausea and a feeling of passing out, which was different from the first episode. The third episode occurred while the patient was in the hospital, during a physical therapy session. The patient did not completely pass out during this episode, but reported feeling hot, clammy, and nauseous.Her pupils were noted to be dilated during this episode. Since these episodes, which occurred over two months ago, she has not had any more fainting spells.  About two years ago, the patient was diagnosed with PMR and later RA. 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 This was determined to be the cause of the patient's low red blood cell count and previous syncope. She's had no recurrence. Hgb is normal  ROS:  per HPI otherwise negative  Current Outpatient Medications on File Prior to Visit  Medication Sig Dispense Refill   acetaminophen (TYLENOL) 500 MG tablet Take 1,000 mg by mouth 3 (three) times daily.     amLODipine (NORVASC) 2.5 MG tablet Take 1  tablet (2.5 mg total) by mouth daily. (Patient taking differently: Take 2.5 mg by mouth 2 (two) times daily.)     ferrous sulfate 325 (65 FE) MG EC tablet Take 1 tablet (325 mg total) by mouth 2 (two) times daily with a meal.     folic acid (FOLVITE) 1 MG tablet Take 1 mg by mouth in the morning and at bedtime.     ibandronate (BONIVA) 150 MG tablet Take 150 mg by mouth every 30 (thirty) days. Take in the morning with a full glass of water, on an empty stomach, and do not take anything else by mouth or lie down for the next 30 min.     losartan (COZAAR) 100 MG tablet Take 100 mg by mouth daily.     methotrexate (RHEUMATREX) 2.5 MG tablet Take 20 mg by mouth once a week. Caution:Chemotherapy. Protect from light.     pantoprazole (PROTONIX) 40 MG tablet Take 1 tablet (40 mg total) by mouth daily before breakfast. 90 tablet 1   polyethylene glycol (MIRALAX / GLYCOLAX) 17 g packet Take 17 g by mouth daily as needed for mild constipation. 14 each 0   pravastatin (PRAVACHOL) 80 MG tablet Take 80 mg by mouth daily.  90 tablet 3   vitamin C (ASCORBIC ACID) 250 MG tablet Take 1 tablet (250 mg total) by mouth daily. 30 tablet 0   zolpidem (AMBIEN) 10 MG tablet Take 5 mg by mouth at bedtime as needed  for sleep.     gabapentin (NEURONTIN) 300 MG capsule Take 300 mg by mouth daily as needed (for hip pain). (Patient not taking: Reported on 04/11/2023)     No current facility-administered medications on file prior to visit.     Studies Reviewed: Marland Kitchen        TTE: EF 60-65%, RV function is normal, normal RVSP, mild MR, mild prolapse  Stress Myoview 09/19/2017: no ischemia  Risk Assessment/Calculations:    Physical Exam:   VS:    Vitals:   04/11/23 1458  BP: 125/81  Pulse: 73  SpO2: 96%     Wt Readings from Last 3 Encounters:  01/29/23 113 lb (51.3 kg)  01/29/23 113 lb (51.3 kg)  04/06/20 117 lb 12.8 oz (53.4 kg)    GEN: Well nourished, well developed in no acute distress NECK: No JVD; No carotid  bruits CARDIAC: RRR, no murmurs, rubs, gallops RESPIRATORY:  Clear to auscultation without rales, wheezing or rhonchi  ABDOMEN: Soft, non-tender, non-distended EXTREMITIES:  No edema; No deformity   ASSESSMENT AND PLAN: .   Syncope - agree with orthostatic and vasovagal syncope episodes. She had no evidence of a cardiac cause - we discussed consideration of a monitor and we both decided that if this happens again, can monitor at that time      Dispo: Follow up PRN  Signed, Maisie Fus, MD

## 2023-04-11 NOTE — Patient Instructions (Signed)
  Follow-Up: At [facility], you and your health needs are our priority.  As part of our continuing mission to provide you with exceptional heart care, we have created designated Provider Care Teams.  These Care Teams include your primary Cardiologist (physician) and Advanced Practice Providers (APPs -  Physician Assistants and Nurse Practitioners) who all work together to provide you with the care you need, when you need it.  We recommend signing up for the patient portal called "MyChart".  Sign up information is provided on this After Visit Summary.  MyChart is used to connect with patients for Virtual Visits (Telemedicine).  Patients are able to view lab/test results, encounter notes, upcoming appointments, etc.  Non-urgent messages can be sent to your provider as well.   To learn more about what you can do with MyChart, go to [email or web address].    Your next appointment:   Follow up as needed

## 2023-04-16 DIAGNOSIS — S82001A Unspecified fracture of right patella, initial encounter for closed fracture: Secondary | ICD-10-CM | POA: Diagnosis not present

## 2023-04-16 DIAGNOSIS — S42144A Nondisplaced fracture of glenoid cavity of scapula, right shoulder, initial encounter for closed fracture: Secondary | ICD-10-CM | POA: Diagnosis not present

## 2023-04-17 DIAGNOSIS — M25661 Stiffness of right knee, not elsewhere classified: Secondary | ICD-10-CM | POA: Diagnosis not present

## 2023-04-17 DIAGNOSIS — M25611 Stiffness of right shoulder, not elsewhere classified: Secondary | ICD-10-CM | POA: Diagnosis not present

## 2023-04-24 DIAGNOSIS — M353 Polymyalgia rheumatica: Secondary | ICD-10-CM | POA: Diagnosis not present

## 2023-04-24 DIAGNOSIS — M0609 Rheumatoid arthritis without rheumatoid factor, multiple sites: Secondary | ICD-10-CM | POA: Diagnosis not present

## 2023-04-24 DIAGNOSIS — M1991 Primary osteoarthritis, unspecified site: Secondary | ICD-10-CM | POA: Diagnosis not present

## 2023-04-24 DIAGNOSIS — Z79899 Other long term (current) drug therapy: Secondary | ICD-10-CM | POA: Diagnosis not present

## 2023-04-24 DIAGNOSIS — Z6822 Body mass index (BMI) 22.0-22.9, adult: Secondary | ICD-10-CM | POA: Diagnosis not present

## 2023-05-25 DIAGNOSIS — Z23 Encounter for immunization: Secondary | ICD-10-CM | POA: Diagnosis not present

## 2023-07-22 DIAGNOSIS — Z79899 Other long term (current) drug therapy: Secondary | ICD-10-CM | POA: Diagnosis not present

## 2023-07-22 DIAGNOSIS — Z6823 Body mass index (BMI) 23.0-23.9, adult: Secondary | ICD-10-CM | POA: Diagnosis not present

## 2023-07-22 DIAGNOSIS — M353 Polymyalgia rheumatica: Secondary | ICD-10-CM | POA: Diagnosis not present

## 2023-07-22 DIAGNOSIS — M0609 Rheumatoid arthritis without rheumatoid factor, multiple sites: Secondary | ICD-10-CM | POA: Diagnosis not present

## 2023-07-22 DIAGNOSIS — M1991 Primary osteoarthritis, unspecified site: Secondary | ICD-10-CM | POA: Diagnosis not present

## 2023-10-05 DIAGNOSIS — J40 Bronchitis, not specified as acute or chronic: Secondary | ICD-10-CM | POA: Diagnosis not present

## 2023-10-21 DIAGNOSIS — M0609 Rheumatoid arthritis without rheumatoid factor, multiple sites: Secondary | ICD-10-CM | POA: Diagnosis not present

## 2023-12-09 DIAGNOSIS — I10 Essential (primary) hypertension: Secondary | ICD-10-CM | POA: Diagnosis not present

## 2023-12-09 DIAGNOSIS — M810 Age-related osteoporosis without current pathological fracture: Secondary | ICD-10-CM | POA: Diagnosis not present

## 2023-12-09 DIAGNOSIS — E785 Hyperlipidemia, unspecified: Secondary | ICD-10-CM | POA: Diagnosis not present

## 2023-12-16 DIAGNOSIS — I10 Essential (primary) hypertension: Secondary | ICD-10-CM | POA: Diagnosis not present

## 2023-12-16 DIAGNOSIS — F5101 Primary insomnia: Secondary | ICD-10-CM | POA: Diagnosis not present

## 2023-12-16 DIAGNOSIS — M0600 Rheumatoid arthritis without rheumatoid factor, unspecified site: Secondary | ICD-10-CM | POA: Diagnosis not present

## 2023-12-16 DIAGNOSIS — E785 Hyperlipidemia, unspecified: Secondary | ICD-10-CM | POA: Diagnosis not present

## 2023-12-16 DIAGNOSIS — Z0000 Encounter for general adult medical examination without abnormal findings: Secondary | ICD-10-CM | POA: Diagnosis not present

## 2023-12-16 DIAGNOSIS — M810 Age-related osteoporosis without current pathological fracture: Secondary | ICD-10-CM | POA: Diagnosis not present

## 2023-12-16 DIAGNOSIS — R82998 Other abnormal findings in urine: Secondary | ICD-10-CM | POA: Diagnosis not present

## 2024-01-20 DIAGNOSIS — M0609 Rheumatoid arthritis without rheumatoid factor, multiple sites: Secondary | ICD-10-CM | POA: Diagnosis not present

## 2024-01-20 DIAGNOSIS — M1991 Primary osteoarthritis, unspecified site: Secondary | ICD-10-CM | POA: Diagnosis not present

## 2024-01-20 DIAGNOSIS — M353 Polymyalgia rheumatica: Secondary | ICD-10-CM | POA: Diagnosis not present

## 2024-01-20 DIAGNOSIS — Z79899 Other long term (current) drug therapy: Secondary | ICD-10-CM | POA: Diagnosis not present

## 2024-01-20 DIAGNOSIS — Z6823 Body mass index (BMI) 23.0-23.9, adult: Secondary | ICD-10-CM | POA: Diagnosis not present

## 2024-02-10 DIAGNOSIS — H2013 Chronic iridocyclitis, bilateral: Secondary | ICD-10-CM | POA: Diagnosis not present

## 2024-02-10 DIAGNOSIS — H40053 Ocular hypertension, bilateral: Secondary | ICD-10-CM | POA: Diagnosis not present

## 2024-02-11 DIAGNOSIS — H2000 Unspecified acute and subacute iridocyclitis: Secondary | ICD-10-CM | POA: Diagnosis not present

## 2024-02-11 DIAGNOSIS — H40053 Ocular hypertension, bilateral: Secondary | ICD-10-CM | POA: Diagnosis not present

## 2024-02-12 DIAGNOSIS — H2000 Unspecified acute and subacute iridocyclitis: Secondary | ICD-10-CM | POA: Diagnosis not present

## 2024-02-12 DIAGNOSIS — H40053 Ocular hypertension, bilateral: Secondary | ICD-10-CM | POA: Diagnosis not present

## 2024-02-20 DIAGNOSIS — H2000 Unspecified acute and subacute iridocyclitis: Secondary | ICD-10-CM | POA: Diagnosis not present

## 2024-03-02 DIAGNOSIS — H40053 Ocular hypertension, bilateral: Secondary | ICD-10-CM | POA: Diagnosis not present

## 2024-03-02 DIAGNOSIS — H2000 Unspecified acute and subacute iridocyclitis: Secondary | ICD-10-CM | POA: Diagnosis not present

## 2024-04-02 ENCOUNTER — Emergency Department (HOSPITAL_BASED_OUTPATIENT_CLINIC_OR_DEPARTMENT_OTHER)
Admission: EM | Admit: 2024-04-02 | Discharge: 2024-04-02 | Disposition: A | Discharge status: Home / Self Care | Attending: Emergency Medicine | Admitting: Emergency Medicine

## 2024-04-02 ENCOUNTER — Emergency Department (HOSPITAL_BASED_OUTPATIENT_CLINIC_OR_DEPARTMENT_OTHER)

## 2024-04-02 ENCOUNTER — Other Ambulatory Visit: Payer: Self-pay

## 2024-04-02 DIAGNOSIS — R55 Syncope and collapse: Secondary | ICD-10-CM | POA: Diagnosis not present

## 2024-04-02 DIAGNOSIS — H539 Unspecified visual disturbance: Secondary | ICD-10-CM | POA: Diagnosis not present

## 2024-04-02 DIAGNOSIS — I517 Cardiomegaly: Secondary | ICD-10-CM | POA: Diagnosis not present

## 2024-04-02 DIAGNOSIS — R42 Dizziness and giddiness: Secondary | ICD-10-CM | POA: Diagnosis not present

## 2024-04-02 DIAGNOSIS — J984 Other disorders of lung: Secondary | ICD-10-CM | POA: Diagnosis not present

## 2024-04-02 DIAGNOSIS — I771 Stricture of artery: Secondary | ICD-10-CM | POA: Diagnosis not present

## 2024-04-02 LAB — TROPONIN T, HIGH SENSITIVITY
Troponin T High Sensitivity: <15 ng/L (ref 0–19)
Troponin T High Sensitivity: <15 ng/L (ref 0–19)

## 2024-04-02 LAB — COMPREHENSIVE METABOLIC PANEL WITH GFR
ALT: 23 U/L (ref 0–44)
AST: 30 U/L (ref 15–41)
Albumin: 4.7 g/dL (ref 3.5–5.0)
Alkaline Phosphatase: 92 U/L (ref 38–126)
Anion gap: 13 (ref 5–15)
BUN: 10 mg/dL (ref 8–23)
CO2: 25 mmol/L (ref 22–32)
Calcium: 10 mg/dL (ref 8.9–10.3)
Chloride: 101 mmol/L (ref 98–111)
Creatinine, Ser: 0.81 mg/dL (ref 0.44–1.00)
GFR, Estimated: >60 mL/min (ref >60)
Glucose, Bld: 100 mg/dL — ABNORMAL HIGH (ref 70–99)
Potassium: 4.4 mmol/L (ref 3.5–5.1)
Sodium: 138 mmol/L (ref 135–145)
Total Bilirubin: 0.8 mg/dL (ref 0.0–1.2)
Total Protein: 7.1 g/dL (ref 6.5–8.1)

## 2024-04-02 LAB — CBG MONITORING, ED: Glucose-Capillary: 91 mg/dL (ref 70–99)

## 2024-04-02 LAB — CBC
HCT: 42.6 % (ref 36.0–46.0)
Hemoglobin: 15 g/dL (ref 12.0–15.0)
MCH: 32.5 pg (ref 26.0–34.0)
MCHC: 35.2 g/dL (ref 30.0–36.0)
MCV: 92.2 fL (ref 80.0–100.0)
Platelets: 211 K/uL (ref 150–400)
RBC: 4.62 MIL/uL (ref 3.87–5.11)
RDW: 13 % (ref 11.5–15.5)
WBC: 7.8 K/uL (ref 4.0–10.5)
nRBC: 0 % (ref 0.0–0.2)

## 2024-04-02 LAB — URINALYSIS, ROUTINE W REFLEX MICROSCOPIC
Bilirubin Urine: NEGATIVE
Glucose, UA: NEGATIVE mg/dL
Hgb urine dipstick: NEGATIVE
Ketones, ur: NEGATIVE mg/dL
Nitrite: NEGATIVE
Protein, ur: NEGATIVE mg/dL
Specific Gravity, Urine: 1.007 (ref 1.005–1.030)
pH: 7 (ref 5.0–8.0)

## 2024-04-02 MED ORDER — SODIUM CHLORIDE 0.9 % IV BOLUS
500.0000 mL | Freq: Once | INTRAVENOUS | Status: AC
  Administered 2024-04-02: 500 mL via INTRAVENOUS

## 2024-04-02 NOTE — Discharge Instructions (Signed)
 Please follow-up closely with your primary care doctor and your eye doctor on an outpatient basis.  Return to emergency department immediately for any new or worsening symptoms.

## 2024-04-02 NOTE — ED Provider Notes (Signed)
 [place] EMERGENCY DEPARTMENT AT [facility] Provider Note   CSN: [id number] Arrival date & time: 04/02/24  1345     Patient presents with: Near Syncope   Chelsea Walton is a 83 y.o. female.   Patient is an 83 year old female who presents to the emergency department with a chief complaint of an onset of lightheaded feeling and feeling as though her vision was becoming dim approximately 2 hours ago.  She has had event similar to this in the past.  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  She denies any melena or hematochezia.  She denies any recent falls or blunt head trauma.  She notes that she has had no numbness, paresthesias or unilateral weakness.  There was no associated chest pain, shortness of breath, palpitations.  She denies any abdominal pain, nausea, vomiting, diarrhea.  She notes that she was otherwise feeling at her baseline prior to onset of this.  She notes that symptoms have improved on presentation to the emergency department.   Near Syncope       Prior to Admission medications   Medication Sig Start Date End Date Taking? Authorizing Provider  acetaminophen  (TYLENOL ) 500 MG tablet Take 1,000 mg by mouth 3 (three) times daily.    [provider]  amLODipine  (NORVASC ) 2.5 MG tablet Take 1 tablet (2.5 mg total) by mouth daily. Patient taking differently: Take 2.5 mg by mouth 2 (two) times daily. 02/02/20   Fairy Frames, MD  ferrous sulfate  325 (65 FE) MG EC tablet Take 1 tablet (325 mg total) by mouth 2 (two) times daily with a meal. 02/02/20   Fairy Frames, MD  folic acid (FOLVITE) 1 MG tablet Take 1 mg by mouth in the morning and at bedtime.    [provider]  gabapentin (NEURONTIN) 300 MG capsule Take 300 mg by mouth daily as needed (for hip pain). Patient not taking: Reported on 04/11/2023    [provider]  ibandronate (BONIVA) 150 MG tablet Take 150 mg by mouth every 30 (thirty) days. Take  in the morning with a full glass of water, on an empty stomach, and do not take anything else by mouth or lie down for the next 30 min.    [provider]  losartan (COZAAR) 100 MG tablet Take 100 mg by mouth daily.    [provider]  methotrexate  (RHEUMATREX) 2.5 MG tablet Take 20 mg by mouth once a week. Caution:Chemotherapy. Protect from light.    [provider]  pantoprazole  (PROTONIX ) 40 MG tablet Take 1 tablet (40 mg total) by mouth daily before breakfast. 05/26/20   Kennedy-Smith, Colleen M, NP  polyethylene glycol (MIRALAX  / GLYCOLAX ) 17 g packet Take 17 g by mouth daily as needed for mild constipation. 01/31/23   Sherrill Cable Latif, DO  pravastatin  (PRAVACHOL ) 80 MG tablet Take 80 mg by mouth daily.  09/30/17   [place], Peter M, MD  vitamin C  (ASCORBIC ACID ) 250 MG tablet Take 1 tablet (250 mg total) by mouth daily. 02/02/20   Fairy Frames, MD  zolpidem  (AMBIEN ) 10 MG tablet Take 5 mg by mouth at bedtime as needed for sleep.    [provider]    Allergies: Meloxicam, Tribenzor [olmesartan-amlodipine -hctz], Ibuprofen, and Metoprolol    Review of Systems  Cardiovascular:  Positive for near-syncope.  Neurological:  Positive for light-headedness.    Updated Vital Signs BP (!) 160/97   Pulse 85   Temp 97.9 F (36.6 C) (Oral)  Resp 18   Ht 4' 11 (1.499 m)   Wt 49.9 kg   SpO2 100%   BMI 22.22 kg/m   Physical Exam Vitals and nursing note reviewed.  Constitutional:      General: She is not in acute distress.    Appearance: Normal appearance. She is not ill-appearing.  HENT:     Head: Normocephalic and atraumatic.     Nose: Nose normal.     Mouth/Throat:     Mouth: Mucous membranes are moist.  Eyes:     Extraocular Movements: Extraocular movements intact.     Conjunctiva/sclera: Conjunctivae normal.     Pupils: Pupils are equal, round, and reactive to light.  Cardiovascular:     Rate and Rhythm: Normal rate and regular rhythm.      Pulses: Normal pulses.     Heart sounds: Normal heart sounds. No murmur heard.    No gallop.  Pulmonary:     Effort: Pulmonary effort is normal. No respiratory distress.     Breath sounds: Normal breath sounds. No stridor. No wheezing, rhonchi or rales.  Abdominal:     General: Abdomen is flat. Bowel sounds are normal. There is no distension.     Palpations: Abdomen is soft.     Tenderness: There is no abdominal tenderness. There is no guarding.  Musculoskeletal:        General: Normal range of motion.     Cervical back: Normal range of motion and neck supple. No rigidity or tenderness.  Skin:    General: Skin is warm and dry.     Findings: No bruising or rash.  Neurological:     General: No focal deficit present.     Mental Status: She is alert and oriented to person, place, and time. Mental status is at baseline.     Cranial Nerves: No cranial nerve deficit.     Sensory: No sensory deficit.     Motor: No weakness.     Coordination: Coordination normal.     Gait: Gait normal.  Psychiatric:        Mood and Affect: Mood normal.        Behavior: Behavior normal.        Thought Content: Thought content normal.        Judgment: Judgment normal.     (all labs ordered are listed, but only abnormal results are displayed) Labs Reviewed  COMPREHENSIVE METABOLIC PANEL WITH GFR  CBC  URINALYSIS, ROUTINE W REFLEX MICROSCOPIC  CBG MONITORING, ED  TROPONIN T, HIGH SENSITIVITY    EKG: EKG Interpretation Date/Time:  Thursday April 02 2024 14:00:52 EDT Ventricular Rate:  78 PR Interval:  141 QRS Duration:  92 QT Interval:  397 QTC Calculation: 453 R Axis:   -5  Text Interpretation: Sinus rhythm Confirmed by Ruthe Cornet [phone]) on 04/02/2024 2:07:08 PM  Radiology: No results found.   Procedures   Medications Ordered in the ED  sodium chloride  0.9 % bolus 500 mL (has no administration in time range)                                    Medical Decision Making Amount  and/or Complexity of Data Reviewed Labs: ordered. Radiology: ordered.   This patient presents to the ED for concern of lightheadedness differential diagnosis includes orthostasis, acute kidney injury, electrolyte derangement, anemia, CVA, TIA, space-occupying lesion, ACS    Additional history obtained:  Additional  history obtained from family External records from outside source obtained and reviewed including medical records   Lab Tests:  I Ordered, and personally interpreted labs.  The pertinent results include: No leukocytosis, no anemia, normal kidney function liver function, normal electrolytes, negative serial troponins, unremarkable urinalysis   Imaging Studies ordered:  I ordered imaging studies including CT scan of head, chest x-ray I independently visualized and interpreted imaging which showed no acute intracranial process, no acute cardiopulmonary process I agree with the radiologist interpretation   Medicines ordered and prescription drug management:  I ordered medication including IV fluids for lightheadedness Reevaluation of the patient after these medicines showed that the patient improved I have reviewed the patients home medicines and have made adjustments as needed   Problem List / ED Course:  Patient is doing very well at this time and is stable for discharge home.  Discussed with patient that all workup in the emergency department has been unremarkable.  Did recommend continued close follow-up with her primary care doctor and ophthalmologist on an outpatient basis.  Patient has no associated active visual change at this point has had no pain to her eyes.  Low suspicion for underlying etiology such as glaucoma at this point as there is no associated pain.  Patient had CT scan which demonstrated no acute findings.  She has no focal deficits on exam and noted she had no focal deficits at home.  Low suspicion for CVA or TIA at this point.  Blood work is otherwise  been unremarkable.  Patient did have negative serial troponins I do not suspect ACS.  EKG demonstrated no acute ischemic changes or arrhythmia.  Do not suspect any further workup or admission is warranted at this time.  Strict return precautions were discussed for any new or worsening symptoms.  Patient voiced understanding and had no additional questions.   Social Determinants of Health:  None        Final diagnoses:  None    ED Discharge Orders     None          Daralene Lonni JONETTA DEVONNA 04/02/24 1636    Ruthe Cornet, DO 04/03/24 0700

## 2024-04-02 NOTE — ED Triage Notes (Signed)
 Pt caox4, ambulatory stating at approx 1230 this afternoon she began feeling light-headed so she laid down and then began having visual changes described as vision has been dim in both eyes. Denies any recent trauma to the head.

## 2024-04-08 DIAGNOSIS — H2000 Unspecified acute and subacute iridocyclitis: Secondary | ICD-10-CM | POA: Diagnosis not present

## 2024-04-08 DIAGNOSIS — H40053 Ocular hypertension, bilateral: Secondary | ICD-10-CM | POA: Diagnosis not present

## 2024-04-09 DIAGNOSIS — H2000 Unspecified acute and subacute iridocyclitis: Secondary | ICD-10-CM | POA: Diagnosis not present

## 2024-04-09 DIAGNOSIS — H40053 Ocular hypertension, bilateral: Secondary | ICD-10-CM | POA: Diagnosis not present

## 2024-04-15 ENCOUNTER — Encounter: Payer: Self-pay | Admitting: *Deleted

## 2024-04-16 ENCOUNTER — Encounter: Payer: Self-pay | Admitting: Internal Medicine

## 2024-04-16 ENCOUNTER — Ambulatory Visit: Attending: Internal Medicine | Admitting: Internal Medicine

## 2024-04-16 VITALS — BP 138/88 | HR 70 | Ht 59.0 in | Wt 114.9 lb

## 2024-04-16 DIAGNOSIS — E785 Hyperlipidemia, unspecified: Secondary | ICD-10-CM | POA: Diagnosis not present

## 2024-04-16 DIAGNOSIS — I10 Essential (primary) hypertension: Secondary | ICD-10-CM | POA: Diagnosis not present

## 2024-04-16 DIAGNOSIS — R55 Syncope and collapse: Secondary | ICD-10-CM

## 2024-04-16 DIAGNOSIS — H40053 Ocular hypertension, bilateral: Secondary | ICD-10-CM | POA: Diagnosis not present

## 2024-04-16 DIAGNOSIS — H2000 Unspecified acute and subacute iridocyclitis: Secondary | ICD-10-CM | POA: Diagnosis not present

## 2024-04-16 NOTE — Progress Notes (Signed)
 Cardiology Office Note:  .   Date:  04/16/2024  ID:  Elveria MARLA Fitzpatrick, DOB [birth year], MRN [id number] PCP: No primary care provider on file.   HeartCare Providers Cardiologist:  None    History of Present Illness: .   SCOTTLYNN LINDELL is a 83 y.o. female.  Discussed the use of AI scribe software for clinical note transcription with the patient, who gave verbal consent to proceed.  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 She was initially referred for syncope.  She has a history of syncope, including a fall last summer with lightheadedness and fainting, resulting in hospitalization. Two weeks ago, she experienced a near syncope episode with lightheadedness and dim vision, leading to an ER visit. A CT scan of the brain showed chronic small vessel disease, consistent with a previous scan. Her hemoglobin was 15, and she was well-hydrated. During the recent episode, she did not experience chest pain, shortness of breath, or palpitations.  Her past medical history includes hypertension and hyperlipidemia. She was previously on pravastatin  and recently switched to rosuvastatin. Her LDL was 77 in May. She maintains hydration by drinking about three 16-ounce bottles of water daily.    ROS: negative except per HPI above.  Studies Reviewed: .        Results LABS Hb: 15 (04/02/2024)  RADIOLOGY Head CT: Chronic small vessel ischemic disease in cerebral white matter (04/02/2024) Chest X-ray: Cardiomegaly (04/02/2024)  DIAGNOSTIC Echocardiogram: Normal (Summer 2024) Risk Assessment/Calculations:       Physical Exam:   VS:  BP 138/88 (BP Location: Left Arm, Patient Position: Sitting, Cuff Size: Normal)   Pulse 70   Ht 4' 11 (1.499 m)   Wt 114 lb 14.4 oz (52.1 kg)   SpO2 98%   BMI 23.21 kg/m    Wt Readings from Last 3 Encounters:   04/16/24 114 lb 14.4 oz (52.1 kg)  04/02/24 110 lb (49.9 kg)  04/11/23 114 lb (51.7 kg)     Physical Exam GENERAL: Alert, cooperative, well developed, no acute distress HEENT: Normocephalic, normal oropharynx, moist mucous membranes CHEST: Clear to auscultation bilaterally, No wheezes, rhonchi, or crackles CARDIOVASCULAR: Normal heart rate and rhythm, S1 and S2 normal without murmurs, No carotid bruit ABDOMEN: Soft, non-tender, non-distended, without organomegaly, Normal bowel sounds EXTREMITIES: No cyanosis or edema NEUROLOGICAL: Cranial nerves grossly intact, Moves all extremities without gross motor or sensory deficit   ASSESSMENT AND PLAN: .    Assessment and Plan Assessment & Plan Syncope and near syncope Recurrent episodes likely orthostatic or vasovagal. Low suspicion for cardiac arrhythmias given normal EKG and infrequent episodes. Discussed low risk of timolol eye drops affecting heart rhythm. - Monitor symptoms and continue regular blood pressure and heart rate checks. - Consider heart monitor if episodes recur or symptoms worsen.  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 No significant changes over the past year. Current management with rosuvastatin. - Continue rosuvastatin therapy to manage cholesterol and stabilize plaques. - Discuss findings with primary care physician for further evaluation if needed.  Hyperlipidemia Switched from pravastatin  to rosuvastatin for better cholesterol management. Current LDL is 77, goal to reduce to less than 50-55. Change aligns with cardiology recommendations. - Continue rosuvastatin 20 mg daily therapy. - Monitor cholesterol levels with primary care physician.  HTN - ok to continue amlodipine  2.5 mg daily, hydrochlorothiazide 12.5 mg  daily      Nalea Salce, MD, FACC

## 2024-04-16 NOTE — Patient Instructions (Addendum)
 Medication Instructions:  No changes  *If you need a refill on your cardiac medications before your next appointment, please call your pharmacy*   Lab Work: Not needed    Testing/Procedures:  Not needed  Follow-Up: At [facility], you and your health needs are our priority.  As part of our continuing mission to provide you with exceptional heart care, we have created designated Provider Care Teams.  These Care Teams include your primary Cardiologist (physician) and Advanced Practice Providers (APPs -  Physician Assistants and Nurse Practitioners) who all work together to provide you with the care you need, when you need it.     Your next appointment:   6 month(s)  The format for your next appointment:   In Person  Provider:   Gayatri A Acharya, MD

## 2024-04-20 DIAGNOSIS — M0609 Rheumatoid arthritis without rheumatoid factor, multiple sites: Secondary | ICD-10-CM | POA: Diagnosis not present

## 2024-04-30 DIAGNOSIS — H2000 Unspecified acute and subacute iridocyclitis: Secondary | ICD-10-CM | POA: Diagnosis not present

## 2024-04-30 DIAGNOSIS — H40053 Ocular hypertension, bilateral: Secondary | ICD-10-CM | POA: Diagnosis not present

## 2024-05-09 DIAGNOSIS — Z23 Encounter for immunization: Secondary | ICD-10-CM | POA: Diagnosis not present

## 2024-05-20 DIAGNOSIS — H5203 Hypermetropia, bilateral: Secondary | ICD-10-CM | POA: Diagnosis not present

## 2024-05-20 DIAGNOSIS — H40053 Ocular hypertension, bilateral: Secondary | ICD-10-CM | POA: Diagnosis not present

## 2024-05-20 DIAGNOSIS — H2000 Unspecified acute and subacute iridocyclitis: Secondary | ICD-10-CM | POA: Diagnosis not present

## 2024-06-08 DIAGNOSIS — H04123 Dry eye syndrome of bilateral lacrimal glands: Secondary | ICD-10-CM | POA: Diagnosis not present

## 2024-06-08 DIAGNOSIS — Z961 Presence of intraocular lens: Secondary | ICD-10-CM | POA: Diagnosis not present

## 2024-06-08 DIAGNOSIS — H2000 Unspecified acute and subacute iridocyclitis: Secondary | ICD-10-CM | POA: Diagnosis not present

## 2024-07-09 DIAGNOSIS — H2013 Chronic iridocyclitis, bilateral: Secondary | ICD-10-CM | POA: Diagnosis not present

## 2024-07-09 DIAGNOSIS — H40053 Ocular hypertension, bilateral: Secondary | ICD-10-CM | POA: Diagnosis not present

## 2024-07-10 DIAGNOSIS — H2000 Unspecified acute and subacute iridocyclitis: Secondary | ICD-10-CM | POA: Diagnosis not present

## 2024-07-13 DIAGNOSIS — H2000 Unspecified acute and subacute iridocyclitis: Secondary | ICD-10-CM | POA: Diagnosis not present
